# Patient Record
Sex: Female | Born: 1990 | Hispanic: Yes | Marital: Married | Smoking: Never smoker
Health system: Southern US, Community
[De-identification: ages and names within clinical notes are randomized; demographics above are authoritative.]

## PROBLEM LIST (undated history)

## (undated) ENCOUNTER — Inpatient Hospital Stay (HOSPITAL_COMMUNITY): Payer: Self-pay

## (undated) DIAGNOSIS — F329 Major depressive disorder, single episode, unspecified: Secondary | ICD-10-CM

## (undated) DIAGNOSIS — R51 Headache: Secondary | ICD-10-CM

## (undated) DIAGNOSIS — F32A Depression, unspecified: Secondary | ICD-10-CM

## (undated) DIAGNOSIS — C9590 Leukemia, unspecified not having achieved remission: Secondary | ICD-10-CM

## (undated) DIAGNOSIS — G4730 Sleep apnea, unspecified: Secondary | ICD-10-CM

## (undated) DIAGNOSIS — F419 Anxiety disorder, unspecified: Secondary | ICD-10-CM

## (undated) DIAGNOSIS — R519 Headache, unspecified: Secondary | ICD-10-CM

## (undated) HISTORY — PX: WISDOM TOOTH EXTRACTION: SHX21

## (undated) HISTORY — DX: Headache, unspecified: R51.9

## (undated) HISTORY — DX: Headache: R51

## (undated) HISTORY — DX: Depression, unspecified: F32.A

## (undated) HISTORY — DX: Anxiety disorder, unspecified: F41.9

## (undated) HISTORY — DX: Major depressive disorder, single episode, unspecified: F32.9

---
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
## 2010-09-27 ENCOUNTER — Encounter: Payer: Self-pay | Admitting: Internal Medicine

## 2010-09-28 ENCOUNTER — Institutional Professional Consult (permissible substitution): Payer: Self-pay | Admitting: Internal Medicine

## 2011-02-14 ENCOUNTER — Emergency Department (HOSPITAL_COMMUNITY): Payer: BC Managed Care – PPO

## 2011-02-14 ENCOUNTER — Encounter (HOSPITAL_COMMUNITY): Payer: Self-pay | Admitting: *Deleted

## 2011-02-14 ENCOUNTER — Emergency Department (HOSPITAL_COMMUNITY)
Admission: EM | Admit: 2011-02-14 | Discharge: 2011-02-14 | Disposition: A | Discharge status: Home / Self Care | Payer: BC Managed Care – PPO | Attending: Emergency Medicine | Admitting: Emergency Medicine

## 2011-02-14 DIAGNOSIS — F341 Dysthymic disorder: Secondary | ICD-10-CM | POA: Insufficient documentation

## 2011-02-14 DIAGNOSIS — R110 Nausea: Secondary | ICD-10-CM | POA: Insufficient documentation

## 2011-02-14 DIAGNOSIS — J45909 Unspecified asthma, uncomplicated: Secondary | ICD-10-CM | POA: Insufficient documentation

## 2011-02-14 DIAGNOSIS — R350 Frequency of micturition: Secondary | ICD-10-CM | POA: Insufficient documentation

## 2011-02-14 DIAGNOSIS — R109 Unspecified abdominal pain: Secondary | ICD-10-CM | POA: Insufficient documentation

## 2011-02-14 DIAGNOSIS — N390 Urinary tract infection, site not specified: Secondary | ICD-10-CM

## 2011-02-14 LAB — URINALYSIS, MICROSCOPIC ONLY
Glucose, UA: NEGATIVE mg/dL
Nitrite: NEGATIVE
Specific Gravity, Urine: 1.015 (ref 1.005–1.030)
pH: 7 (ref 5.0–8.0)

## 2011-02-14 MED ORDER — ONDANSETRON 4 MG PO TBDP
8.0000 mg | ORAL_TABLET | Freq: Once | ORAL | Status: AC
  Administered 2011-02-14: 8 mg via ORAL
  Filled 2011-02-14: qty 2

## 2011-02-14 MED ORDER — CIPROFLOXACIN HCL 250 MG PO TABS: 250.0000 mg | ORAL_TABLET | Freq: Two times a day (BID) | ORAL | Status: AC

## 2011-02-14 MED ORDER — ONDANSETRON 8 MG PO TBDP: 8.0000 mg | ORAL_TABLET | Freq: Three times a day (TID) | ORAL | Status: AC | PRN

## 2011-02-14 MED ORDER — OXYCODONE-ACETAMINOPHEN 5-325 MG PO TABS
1.0000 | ORAL_TABLET | Freq: Once | ORAL | Status: AC
  Administered 2011-02-14: 1 via ORAL
  Filled 2011-02-14: qty 1

## 2011-02-14 NOTE — ED Notes (Signed)
Pt reports Sunday and Monday saw blood in stool

## 2011-02-14 NOTE — ED Notes (Signed)
Received report from Laneta Simmers, Charity fundraiser. Pt to move over here to wait for ct scan of abd/pelvis w/o contrast.

## 2011-02-14 NOTE — ED Notes (Signed)
Pt reports having (R) sided flank pain, sudden onset tonight.  Reports that when she voided she felt much better.  Pt noted to have blood shot eyes from vomiting forcefully last night.  Denies vomiting tonight.  Skin warm, dry and itnact.  Neuro intact.

## 2011-02-14 NOTE — ED Notes (Signed)
PT sts 0100 went to bed with cramping in stomach and then woke up with pain under left rib area.  No injury.  Pt only reports nausea.  Pt sts frequent urination a couple days ago.  LMP- 3 weeks ago.  Pt reports left flank pain too.

## 2011-02-14 NOTE — ED Provider Notes (Signed)
History     CSN: [id number]  Arrival date & time 02/14/11  0410   First MD Initiated Contact with Patient 02/14/11 [phone]      Chief Complaint  Patient presents with  . Flank Pain    left rib pain    (Consider location/radiation/quality/duration/timing/severity/associated sxs/prior treatment) Patient is a 21 y.o. female presenting with flank pain. The history is provided by the patient.  Flank Pain This is a new problem. The current episode started today. The problem occurs intermittently. The problem has been waxing and waning. Associated symptoms include nausea and urinary symptoms. Pertinent negatives include no abdominal pain, anorexia, change in bowel habit, chest pain, chills, coughing, fever, myalgias, rash or vomiting. The symptoms are aggravated by nothing. She has tried nothing for the symptoms.  Has had intermittent pain in her L flank which awoke her from sleep this evening; was described as a sharp stabbing sensation and lasted for close to 30 minutes. The pain eased and then returned again. It does not radiate. She recalled having a crampy sensation in her abd after dinner last evening. Recalls increased frequency with urination several days ago, but no irritative voiding sx. Denies vaginal dc. Appetite normal.  Her mom has hx of recurrent nephrolithiasis and has had to have urologic interventions to have several of her stones removed.  Past Medical History  Diagnosis Date  . Anxiety   . Depression   . Asthma     History reviewed. No pertinent past surgical history.  History reviewed. No pertinent family history.  History  Substance Use Topics  . Smoking status: Never Smoker   . Smokeless tobacco: Not on file  . Alcohol Use: Yes     occ    OB History    Grav Para Term Preterm Abortions TAB SAB Ect Mult Living                  Review of Systems  Constitutional: Negative for fever and chills.  HENT: Negative.   Respiratory: Negative for cough.     Cardiovascular: Negative for chest pain.  Gastrointestinal: Positive for nausea. Negative for vomiting, abdominal pain, diarrhea, constipation, anorexia and change in bowel habit.  Genitourinary: Positive for flank pain. Negative for vaginal bleeding, vaginal discharge, menstrual problem and pelvic pain.  Musculoskeletal: Negative for myalgias.  Skin: Negative for rash.    Allergies  Review of patient's allergies indicates no known allergies.  Home Medications   Current Outpatient Rx  Name Route Sig Dispense Refill  . ACETAMINOPHEN 500 MG PO TABS Oral Take 1,000 mg by mouth every 6 (six) hours as needed. For pain     . ARIPIPRAZOLE 2 MG PO TABS Oral Take 2 mg by mouth daily.      Marland Kitchen FLUOXETINE HCL 10 MG PO CAPS Oral Take 10 mg by mouth daily.      Marland Kitchen LEVONORGESTREL-ETHINYL ESTRAD 0.1-20 MG-MCG PO TABS Oral Take 1 tablet by mouth daily.        BP 135/82  Pulse 92  Temp(Src) 98.2 F (36.8 C) (Oral)  Resp 18  SpO2 100%  LMP 01/30/2011  Physical Exam  Nursing note and vitals reviewed. Constitutional: She is oriented to person, place, and time. She appears well-developed and well-nourished. No distress.       Nontoxic appearing  HENT:  Head: Normocephalic and atraumatic.  Eyes: Conjunctivae are normal.  Neck: Normal range of motion.  Cardiovascular: Normal rate, regular rhythm and normal heart sounds.   Pulmonary/Chest: Effort normal and  breath sounds normal.  Abdominal: Soft. Bowel sounds are normal. There is no tenderness. There is no rebound.       Tender to palp over L flank; negative CVA tenderness b/l  Musculoskeletal: Normal range of motion.  Neurological: She is alert and oriented to person, place, and time.  Skin: Skin is warm and dry. She is not diaphoretic.    ED Course  Procedures (including critical care time)  Labs Reviewed  URINALYSIS, MICROSCOPIC ONLY - Abnormal; Notable for the following:    APPearance CLOUDY (*)    Hgb urine dipstick LARGE (*)     Protein, ur 30 (*)    Leukocytes, UA MODERATE (*)    Bacteria, UA MANY (*)    Squamous Epithelial / LPF FEW (*)    All other components within normal limits  POCT PREGNANCY, URINE  POCT PREGNANCY, URINE   Ct Abdomen Pelvis Wo Contrast  02/14/2011  *RADIOLOGY REPORT*  Clinical Data: Left flank pain with hematuria.  Question ureteral calculus.  CT ABDOMEN AND PELVIS WITHOUT CONTRAST  Technique:  Multidetector CT imaging of the abdomen and pelvis was performed following the standard protocol without intravenous contrast.  Comparison: None.  Findings: The lung bases are clear.  There is no pleural effusion. Both kidneys appear normal as imaged in the noncontrast state. There is no evidence of urinary tract calculus, hydronephrosis or perinephric soft tissue stranding.  The liver, spleen, gallbladder, pancreas and adrenal glands appear normal.  Moderate stool is noted throughout the colon.  The appendix appears normal.  The uterus, adnexa and urinary bladder appear unremarkable.  Prominent bilateral facet hypertrophy is demonstrated at L5-S1 for age.  IMPRESSION:  1.  No evidence of urinary tract calculus or hydronephrosis. 2.  No acute abdominal pelvic findings. 3.  L5-S1 facet disease.  Original Report Authenticated By: Gerrianne Scale, M.D.     1. Urinary tract infection       MDM  UA with evidence of UTI. Given her intermittent pain in the L flank and family hx stones, I had some concern for renal colic. CT abd was obtained which was negative and no evidence of pyelo noted. Will treat with Cipro. She was also given rx for Zofran. Discussed return precautions. Pt and mom verbalized understanding and agreed to plan.        Grant Fontana, [place] 02/14/11 2229

## 2011-02-16 NOTE — ED Provider Notes (Signed)
History/physical exam/procedure(s) were performed by non-physician practitioner and as supervising physician I was immediately available for consultation/collaboration. I have reviewed all notes and am in agreement with care and plan.   Hilario Quarry, MD 02/16/11 1239

## 2011-11-13 DIAGNOSIS — J45990 Exercise induced bronchospasm: Secondary | ICD-10-CM | POA: Insufficient documentation

## 2012-03-06 DIAGNOSIS — Z9109 Other allergy status, other than to drugs and biological substances: Secondary | ICD-10-CM | POA: Insufficient documentation

## 2016-02-14 NOTE — L&D Delivery Note (Signed)
Delivery Note At 1:42 PM a viable female was delivered via OA Presentation   APGAR: 9 9  Placenta status:spontaneously with 3 vessel cord , .  Cord:  with the following complications:tight .  Cord pH: not obtained  Anesthesia:  epidural Episiotomy:  none Lacerations:  first Suture Repair: 3.0 chromic Est. Blood Loss (mL):  300  Mom to postpartum.  Baby to Couplet care / Skin to Skin.  Corby Vandenberghe L 10/02/2016, 1:55 PM

## 2016-02-29 LAB — OB RESULTS CONSOLE GC/CHLAMYDIA
CHLAMYDIA, DNA PROBE: NEGATIVE
GC PROBE AMP, GENITAL: NEGATIVE

## 2016-02-29 LAB — OB RESULTS CONSOLE ABO/RH: RH Type: POSITIVE

## 2016-02-29 LAB — OB RESULTS CONSOLE RUBELLA ANTIBODY, IGM: Rubella: IMMUNE

## 2016-02-29 LAB — OB RESULTS CONSOLE RPR: RPR: NONREACTIVE

## 2016-02-29 LAB — OB RESULTS CONSOLE HIV ANTIBODY (ROUTINE TESTING): HIV: NONREACTIVE

## 2016-02-29 LAB — OB RESULTS CONSOLE ANTIBODY SCREEN: ANTIBODY SCREEN: NEGATIVE

## 2016-02-29 LAB — OB RESULTS CONSOLE HEPATITIS B SURFACE ANTIGEN: Hepatitis B Surface Ag: NEGATIVE

## 2016-08-28 LAB — OB RESULTS CONSOLE GBS: STREP GROUP B AG: POSITIVE

## 2016-08-30 ENCOUNTER — Inpatient Hospital Stay (HOSPITAL_COMMUNITY)
Admission: AD | Admit: 2016-08-30 | Discharge: 2016-08-31 | Disposition: A | Discharge status: Home / Self Care | Payer: BLUE CROSS/BLUE SHIELD | Source: Ambulatory Visit | Attending: Obstetrics and Gynecology | Admitting: Obstetrics and Gynecology

## 2016-08-30 ENCOUNTER — Encounter: Payer: Self-pay | Admitting: Student

## 2016-08-30 DIAGNOSIS — O4703 False labor before 37 completed weeks of gestation, third trimester: Secondary | ICD-10-CM

## 2016-08-30 DIAGNOSIS — Z3A34 34 weeks gestation of pregnancy: Secondary | ICD-10-CM | POA: Diagnosis not present

## 2016-08-30 LAB — URINALYSIS, ROUTINE W REFLEX MICROSCOPIC
Bilirubin Urine: NEGATIVE
Glucose, UA: NEGATIVE mg/dL
HGB URINE DIPSTICK: NEGATIVE
Ketones, ur: NEGATIVE mg/dL
Leukocytes, UA: NEGATIVE
NITRITE: NEGATIVE
PH: 7 (ref 5.0–8.0)
Protein, ur: NEGATIVE mg/dL
SPECIFIC GRAVITY, URINE: 1.003 — AB (ref 1.005–1.030)

## 2016-08-30 NOTE — MAU Provider Note (Signed)
History     CSN: [id number]  Arrival date and time: 08/30/16 2248  First Provider Initiated Contact with Patient 08/30/16 2329      Chief Complaint  Patient presents with  . Contractions   HPI  Haley Vang is a 26 y.o. G1P0 at 652w6d who presents with contractions. Reports feeling "braxton hicks" contractions 7+ times per hour since yesterday. Rates pain 5/10. Has not treated. Denies vaginal bleeding, LOF, or recent intercourse. Positive fetal movement.   OB History    Gravida Para Term Preterm AB Living   1             SAB TAB Ectopic Multiple Live Births                  Past Medical History:  Diagnosis Date  . Anxiety   . Asthma   . Depression     Past Surgical History:  Procedure Laterality Date  . WISDOM TOOTH EXTRACTION      History reviewed. No pertinent family history.  Social History  Substance Use Topics  . Smoking status: Never Smoker  . Smokeless tobacco: Never Used  . Alcohol use Yes     Comment: occ    Allergies: No Known Allergies  Prescriptions Prior to Admission  Medication Sig Dispense Refill Last Dose  . acetaminophen (TYLENOL) 500 MG tablet Take 1,000 mg by mouth every 6 (six) hours as needed. For pain    02/14/2011 at Unknown  . ARIPiprazole (ABILIFY) 2 MG tablet Take 2 mg by mouth daily.     02/13/2011 at Unknown  . FLUoxetine (PROZAC) 10 MG capsule Take 10 mg by mouth daily.     02/13/2011 at Unknown  . [DISCONTINUED] levonorgestrel-ethinyl estradiol (AVIANE,ALESSE,LESSINA) 0.1-20 MG-MCG tablet Take 1 tablet by mouth daily.     02/13/2011 at Unknown    Review of Systems  Constitutional: Negative.   Gastrointestinal: Positive for abdominal pain. Negative for constipation, diarrhea, nausea and vomiting.  Genitourinary: Negative.    Physical Exam   Blood pressure 132/83, pulse 92, temperature 98.2 F (36.8 C), temperature source Oral, resp. rate 18, height 5\' 5"  (1.651 m), weight 278 lb (126.1 kg), SpO2 99 %.  Physical Exam  Nursing  note and vitals reviewed. Constitutional: She is oriented to person, place, and time. She appears well-developed and well-nourished. No distress.  HENT:  Head: Normocephalic and atraumatic.  Eyes: Conjunctivae are normal. Right eye exhibits no discharge. Left eye exhibits no discharge. No scleral icterus.  Neck: Normal range of motion.  Respiratory: Effort normal. No respiratory distress.  GI: Soft. There is no tenderness.  Neurological: She is alert and oriented to person, place, and time.  Skin: Skin is warm and dry. She is not diaphoretic.  Psychiatric: She has a normal mood and affect. Her behavior is normal. Judgment and thought content normal.   Dilation: Fingertip Effacement (%): Thick Cervical Position: Posterior Exam by:: Judeth HornErin Gladie Gravette, NP  Fetal Tracing:  Baseline: 130 Variability: moderate Accelerations: 15x15 Decelerations: none  Toco: irr ctx MAU Course  Procedures Results for orders placed or performed during the hospital encounter of 08/30/16 (from the past 24 hour(s))  Urinalysis, Routine w reflex microscopic     Status: Abnormal   Collection Time: 08/30/16 11:05 PM  Result Value Ref Range   Color, Urine STRAW (A) YELLOW   APPearance HAZY (A) CLEAR   Specific Gravity, Urine 1.003 (L) 1.005 - 1.030   pH 7.0 5.0 - 8.0   Glucose, UA NEGATIVE NEGATIVE mg/dL  Hgb urine dipstick NEGATIVE NEGATIVE   Bilirubin Urine NEGATIVE NEGATIVE   Ketones, ur NEGATIVE NEGATIVE mg/dL   Protein, ur NEGATIVE NEGATIVE mg/dL   Nitrite NEGATIVE NEGATIVE   Leukocytes, UA NEGATIVE NEGATIVE    MDM Reactive NST SVE unchanged while in MAU in ctx inconsistent S/w Dr. Elon Spanner. Ok to discharge home  Assessment and Plan  A: 1. Preterm uterine contractions in third trimester, antepartum    P: Discharge home Discussed reasons to return to MAU Keep f/u with OB  Judeth Horn 08/30/2016, 11:29 PM

## 2016-08-30 NOTE — MAU Note (Signed)
Pt reports more than 7 contractions per hour. Marland Kitchen. Has been contracting since yesterday.

## 2016-08-31 DIAGNOSIS — O4703 False labor before 37 completed weeks of gestation, third trimester: Secondary | ICD-10-CM

## 2016-08-31 NOTE — Discharge Instructions (Signed)

## 2016-09-20 ENCOUNTER — Encounter (HOSPITAL_COMMUNITY): Admission: AD | Disposition: A | Discharge status: Home / Self Care | Payer: Self-pay | Source: Ambulatory Visit

## 2016-09-20 ENCOUNTER — Encounter (HOSPITAL_COMMUNITY): Payer: Self-pay | Admitting: *Deleted

## 2016-09-20 ENCOUNTER — Inpatient Hospital Stay (HOSPITAL_COMMUNITY): Payer: BC Managed Care – PPO | Admitting: Anesthesiology

## 2016-09-20 ENCOUNTER — Inpatient Hospital Stay (HOSPITAL_COMMUNITY)
Admission: AD | Admit: 2016-09-20 | Discharge: 2016-09-20 | Disposition: A | Discharge status: Home / Self Care | Payer: BC Managed Care – PPO | Source: Ambulatory Visit | Attending: Obstetrics and Gynecology | Admitting: Obstetrics and Gynecology

## 2016-09-20 ENCOUNTER — Inpatient Hospital Stay (HOSPITAL_COMMUNITY): Payer: BC Managed Care – PPO

## 2016-09-20 DIAGNOSIS — Z3A37 37 weeks gestation of pregnancy: Secondary | ICD-10-CM | POA: Diagnosis not present

## 2016-09-20 DIAGNOSIS — O368330 Maternal care for abnormalities of the fetal heart rate or rhythm, third trimester, not applicable or unspecified: Secondary | ICD-10-CM | POA: Diagnosis not present

## 2016-09-20 DIAGNOSIS — O368390 Maternal care for abnormalities of the fetal heart rate or rhythm, unspecified trimester, not applicable or unspecified: Secondary | ICD-10-CM | POA: Diagnosis present

## 2016-09-20 DIAGNOSIS — Z3689 Encounter for other specified antenatal screening: Secondary | ICD-10-CM | POA: Diagnosis not present

## 2016-09-20 SURGERY — Surgical Case
Anesthesia: Regional

## 2016-09-20 MED ORDER — FENTANYL CITRATE (PF) 100 MCG/2ML IJ SOLN
INTRAMUSCULAR | Status: AC
Start: 2016-09-20 — End: ?
  Filled 2016-09-20: qty 2

## 2016-09-20 MED ORDER — BUPIVACAINE HCL (PF) 0.5 % IJ SOLN
INTRAMUSCULAR | Status: AC
  Filled 2016-09-20: qty 30

## 2016-09-20 MED ORDER — MIDAZOLAM HCL 2 MG/2ML IJ SOLN
INTRAMUSCULAR | Status: AC
  Filled 2016-09-20: qty 2

## 2016-09-20 MED ORDER — SUCCINYLCHOLINE CHLORIDE 200 MG/10ML IV SOSY: PREFILLED_SYRINGE | INTRAVENOUS | Status: DC | PRN

## 2016-09-20 MED ORDER — FENTANYL CITRATE (PF) 250 MCG/5ML IJ SOLN
INTRAMUSCULAR | Status: AC
  Filled 2016-09-20: qty 5

## 2016-09-20 MED ORDER — PROPOFOL 10 MG/ML IV BOLUS: INTRAVENOUS | Status: DC | PRN

## 2016-09-20 MED ORDER — MIDAZOLAM HCL 2 MG/2ML IJ SOLN: INTRAMUSCULAR | Status: DC | PRN

## 2016-09-20 MED ORDER — FENTANYL CITRATE (PF) 250 MCG/5ML IJ SOLN: INTRAMUSCULAR | Status: DC | PRN

## 2016-09-20 MED ORDER — LACTATED RINGERS IV SOLN
INTRAVENOUS | Status: AC | PRN
  Administered 2016-09-20: 19:00:00 via INTRAVENOUS

## 2016-09-20 NOTE — Anesthesia Procedure Notes (Deleted)
Procedure Name: Intubation Performed by: Ignacia Bayley Pre-anesthesia Checklist: Patient identified, Emergency Drugs available, Suction available, Patient being monitored and Timeout performed Patient Re-evaluated:Patient Re-evaluated prior to induction Oxygen Delivery Method: Circle system utilized Preoxygenation: Pre-oxygenation with 100% oxygen Induction Type: IV induction Laryngoscope Size: Glidescope, 3 and Mac Grade View: Grade II Tube type: Oral Tube size: 7.0 mm Number of attempts: 1 Airway Equipment and Method: Video-laryngoscopy Placement Confirmation: ETT inserted through vocal cords under direct vision,  positive ETCO2 and breath sounds checked- equal and bilateral Secured at: 21 cm Tube secured with: Tape Dental Injury: Teeth and Oropharynx as per pre-operative assessment

## 2016-09-20 NOTE — MAU Note (Signed)
Presents to mau following office visit today.  nst was questionable. Sent to hospital for nst and ultrsound.

## 2016-09-20 NOTE — MAU Provider Note (Signed)
  History     CSN: [id number]  Arrival date and time: 09/20/16 1614   First Provider Initiated Contact with Patient 09/20/16 1756      Chief Complaint  Patient presents with  . Non-stress Test   HPI   [name] is a 26 y.o. female G1P0 @ 2748w5d here in MAU for a stress test and a BPP. She was seen in the office today for a stress test and there was a questionable fetal deceleration. She was sent here for further fetal monitoring and a BPP. + fetal movement. Denies vaginal bleeding.   OB History    Gravida Para Term Preterm AB Living   1             SAB TAB Ectopic Multiple Live Births                  Past Medical History:  Diagnosis Date  . Anxiety   . Asthma   . Depression     Past Surgical History:  Procedure Laterality Date  . WISDOM TOOTH EXTRACTION      No family history on file.  Social History  Substance Use Topics  . Smoking status: Never Smoker  . Smokeless tobacco: Never Used  . Alcohol use Yes     Comment: occ    Allergies: No Known Allergies  Prescriptions Prior to Admission  Medication Sig Dispense Refill Last Dose  . acetaminophen (TYLENOL) 500 MG tablet Take 1,000 mg by mouth every 6 (six) hours as needed. For pain    02/14/2011 at Unknown  . ARIPiprazole (ABILIFY) 2 MG tablet Take 2 mg by mouth daily.     02/13/2011 at Unknown  . FLUoxetine (PROZAC) 10 MG capsule Take 10 mg by mouth daily.     02/13/2011 at Unknown   No results found for this or any previous visit (from the past 48 hour(s)).  Review of Systems  Gastrointestinal: Negative for abdominal pain.  Genitourinary: Negative for vaginal bleeding and vaginal discharge.   Physical Exam   Blood pressure 134/76, pulse 100, temperature 98.1 F (36.7 C), resp. rate 18, SpO2 100 %.  Physical Exam  Constitutional: She is oriented to person, place, and time. She appears well-developed and well-nourished. No distress.  HENT:  Head: Normocephalic.  Eyes: Pupils are equal, round, and  reactive to light.  Musculoskeletal: Normal range of motion.  Neurological: She is alert and oriented to person, place, and time.  Skin: Skin is warm. She is not diaphoretic.  Psychiatric: Her behavior is normal.   Fetal Tracing: Baseline: 140 bpm Variability: moderate  Accelerations: 15x15 Decelerations: none Toco: 2 contractions noted.   MAU Course  Procedures  None  MDM  BPP ordered  BPP 8/8 Discussed with Dr. Vincente PoliGrewal   Assessment and Plan   A:  1. [redacted] weeks gestation of pregnancy   2. Antepartum variable deceleration   3. NST (non-stress test) reactive     P:  Discharge home in stable condition  Return to MAU if symptoms worsen  Follow up with Dr. Arvil PersonsGrewal  Rasch, Harolyn RutherfordJennifer I, NP 09/20/2016 9:00 PM

## 2016-09-20 NOTE — Discharge Instructions (Signed)
Biophysical Profile °A biophysical profile is a non-invasive test that may be done during pregnancy to check that your developing baby (fetus) and your placenta are healthy. Your health care provider may recommend a biophysical profile if your pregnancy is at a higher risk for certain problems. A biophysical profile is usually done during the last 3 months of pregnancy (third trimester). °A biophysical profile combines two tests to check the health of your baby. In one test, you will have a device strapped to your belly to measure your baby’s heart rate. The other test involves using sound waves and a computer (ultrasound) to create an image of your baby inside your womb (uterus). Together, these tests tell your health care provider about the overall health of your baby. °Tell a health care provider about: °· Any allergies you have. °· All medicines you are taking, including vitamins, herbs, eye drops, creams, and over-the-counter medicines. °· Any medical conditions you have. °· Any concerns you have about your pregnancy. °· Any symptoms such as abdominal pain or contractions, nausea or vomiting, vaginal bleeding, leaking of amniotic fluid, decreased fetal movements, fever or infection, increased swelling, headaches, or visual disturbances. °· How often you feel your baby move. °What are the risks? °There are no risks to you or your baby from a biophysical profile. °What happens before the procedure? °Ask your health care provider how to prepare. °· You may need to drink fluids so that you have a full bladder for your ultrasound. °· You may also need to eat before you arrive for the test. That makes your baby more active. ° °What happens during the procedure? °· You will lie on your back on an exam table. °· Your blood pressure may be monitored during the procedure. °· A belt will be placed around your belly. The belt has a sensor to measure your baby’s heart rate. °· You may have to wear another belt and sensor to  measure any muscle movements (contractions) in your uterus. During the ultrasound, a health care provider or technician will gently roll a handheld device (transducer) over your belly. This device sends signals to a computer that creates images of your baby. °· Five areas of your baby’s health and development will be checked during the biophysical profile: °? Heart rate. °? Breathing. °? Movement. °? Active muscle movement (muscle tone). °? The amount of fluid in your uterus (amniotic fluid). °The procedure may vary among health care providers and hospitals. °What happens after the procedure? °· Your health care provider will discuss your results with you. The results of a biophysical profile are scored in a range of 0 to 10. Each area that is evaluated is given a score of 0 or 2 points. If you get a score of 6 or less, you may need further testing, or your baby may need to be delivered early. A score of 8 to 10 with normal amniotic fluid levels is considered normal. °· Unless you need additional testing, you can go home right after the procedure and resume your normal activities. °Summary °· A biophysical profile is a non-invasive test that may be done during pregnancy to check that your developing baby (fetus) and your placenta are healthy. °· A biophysical profile combines two tests: A test to measure your baby's heart rate and an ultrasound test to create an image of your baby in the womb. °· Tell your health care provider about any concerns you have about your pregnancy or any pregnancy-related symptoms. °· If you   get a score of 6 or less, you may need further testing, or your baby may need to be delivered early. A score of 8 to 10 with normal amniotic fluid levels is considered normal. °This information is not intended to replace advice given to you by your health care provider. Make sure you discuss any questions you have with your health care provider. °Document Released: 01/20/2002 Document Revised:  03/03/2016 Document Reviewed: 03/03/2016 °Elsevier Interactive Patient Education © 2018 [employer] ° °

## 2016-09-21 ENCOUNTER — Encounter (HOSPITAL_COMMUNITY): Payer: Self-pay | Admitting: *Deleted

## 2016-09-21 ENCOUNTER — Telehealth (HOSPITAL_COMMUNITY): Payer: Self-pay | Admitting: *Deleted

## 2016-09-21 NOTE — Telephone Encounter (Signed)
Preadmission screen  

## 2016-10-01 ENCOUNTER — Encounter (HOSPITAL_COMMUNITY): Payer: Self-pay

## 2016-10-01 ENCOUNTER — Inpatient Hospital Stay (HOSPITAL_COMMUNITY)
Admission: AD | Admit: 2016-10-01 | Discharge: 2016-10-04 | DRG: 775 | Disposition: A | Discharge status: Home / Self Care | Payer: BC Managed Care – PPO | Source: Ambulatory Visit | Attending: Obstetrics and Gynecology | Admitting: Obstetrics and Gynecology

## 2016-10-01 DIAGNOSIS — O99824 Streptococcus B carrier state complicating childbirth: Secondary | ICD-10-CM | POA: Diagnosis present

## 2016-10-01 DIAGNOSIS — O9952 Diseases of the respiratory system complicating childbirth: Secondary | ICD-10-CM | POA: Diagnosis present

## 2016-10-01 DIAGNOSIS — J45909 Unspecified asthma, uncomplicated: Secondary | ICD-10-CM | POA: Diagnosis present

## 2016-10-01 DIAGNOSIS — Z3A39 39 weeks gestation of pregnancy: Secondary | ICD-10-CM | POA: Diagnosis not present

## 2016-10-01 DIAGNOSIS — O26893 Other specified pregnancy related conditions, third trimester: Secondary | ICD-10-CM | POA: Diagnosis present

## 2016-10-01 LAB — CBC
HEMATOCRIT: 34.2 % — AB (ref 36.0–46.0)
HEMOGLOBIN: 11.6 g/dL — AB (ref 12.0–15.0)
MCH: 28.9 pg (ref 26.0–34.0)
MCHC: 33.9 g/dL (ref 30.0–36.0)
MCV: 85.3 fL (ref 78.0–100.0)
Platelets: 220 10*3/uL (ref 150–400)
RBC: 4.01 MIL/uL (ref 3.87–5.11)
RDW: 15 % (ref 11.5–15.5)
WBC: 6.9 10*3/uL (ref 4.0–10.5)

## 2016-10-01 LAB — TYPE AND SCREEN
ABO/RH(D): O POS
Antibody Screen: NEGATIVE

## 2016-10-01 LAB — POCT FERN TEST

## 2016-10-01 LAB — ABO/RH: ABO/RH(D): O POS

## 2016-10-01 MED ORDER — OXYCODONE-ACETAMINOPHEN 5-325 MG PO TABS: 1.0000 | ORAL_TABLET | ORAL | Status: DC | PRN

## 2016-10-01 MED ORDER — PHENYLEPHRINE 40 MCG/ML (10ML) SYRINGE FOR IV PUSH (FOR BLOOD PRESSURE SUPPORT)
80.0000 ug | PREFILLED_SYRINGE | INTRAVENOUS | Status: DC | PRN
  Filled 2016-10-01: qty 5

## 2016-10-01 MED ORDER — FLEET ENEMA 7-19 GM/118ML RE ENEM: 1.0000 | ENEMA | RECTAL | Status: DC | PRN

## 2016-10-01 MED ORDER — OXYTOCIN BOLUS FROM INFUSION
500.0000 mL | Freq: Once | INTRAVENOUS | Status: AC
  Administered 2016-10-02: 500 mL via INTRAVENOUS

## 2016-10-01 MED ORDER — ACETAMINOPHEN 325 MG PO TABS: 650.0000 mg | ORAL_TABLET | ORAL | Status: DC | PRN

## 2016-10-01 MED ORDER — EPHEDRINE 5 MG/ML INJ
10.0000 mg | INTRAVENOUS | Status: DC | PRN
  Filled 2016-10-01: qty 2

## 2016-10-01 MED ORDER — LIDOCAINE HCL (PF) 1 % IJ SOLN
30.0000 mL | INTRAMUSCULAR | Status: DC | PRN
  Filled 2016-10-01: qty 30

## 2016-10-01 MED ORDER — OXYTOCIN 40 UNITS IN LACTATED RINGERS INFUSION - SIMPLE MED
1.0000 m[IU]/min | INTRAVENOUS | Status: DC
  Administered 2016-10-01: 1 m[IU]/min via INTRAVENOUS

## 2016-10-01 MED ORDER — TERBUTALINE SULFATE 1 MG/ML IJ SOLN
0.2500 mg | Freq: Once | INTRAMUSCULAR | Status: DC | PRN
  Filled 2016-10-01: qty 1

## 2016-10-01 MED ORDER — LACTATED RINGERS IV SOLN
500.0000 mL | Freq: Once | INTRAVENOUS | Status: AC
  Administered 2016-10-02: 500 mL via INTRAVENOUS

## 2016-10-01 MED ORDER — FENTANYL 2.5 MCG/ML BUPIVACAINE 1/10 % EPIDURAL INFUSION (WH - ANES)
14.0000 mL/h | INTRAMUSCULAR | Status: DC | PRN
  Administered 2016-10-02 (×2): 14 mL/h via EPIDURAL
  Filled 2016-10-01 (×2): qty 100

## 2016-10-01 MED ORDER — LACTATED RINGERS IV SOLN
500.0000 mL | INTRAVENOUS | Status: DC | PRN
  Administered 2016-10-02: 500 mL via INTRAVENOUS

## 2016-10-01 MED ORDER — SOD CITRATE-CITRIC ACID 500-334 MG/5ML PO SOLN: 30.0000 mL | ORAL | Status: DC | PRN

## 2016-10-01 MED ORDER — PHENYLEPHRINE 40 MCG/ML (10ML) SYRINGE FOR IV PUSH (FOR BLOOD PRESSURE SUPPORT)
80.0000 ug | PREFILLED_SYRINGE | INTRAVENOUS | Status: DC | PRN
  Filled 2016-10-01: qty 10
  Filled 2016-10-01: qty 5

## 2016-10-01 MED ORDER — ARIPIPRAZOLE 2 MG PO TABS
2.0000 mg | ORAL_TABLET | Freq: Every day | ORAL | Status: DC
  Filled 2016-10-01 (×2): qty 1

## 2016-10-01 MED ORDER — DIPHENHYDRAMINE HCL 50 MG/ML IJ SOLN: 12.5000 mg | INTRAMUSCULAR | Status: DC | PRN

## 2016-10-01 MED ORDER — OXYTOCIN 40 UNITS IN LACTATED RINGERS INFUSION - SIMPLE MED
2.5000 [IU]/h | INTRAVENOUS | Status: DC
  Filled 2016-10-01: qty 1000

## 2016-10-01 MED ORDER — LACTATED RINGERS IV SOLN
INTRAVENOUS | Status: DC
  Administered 2016-10-01 – 2016-10-02 (×2): via INTRAVENOUS

## 2016-10-01 MED ORDER — SODIUM CHLORIDE 0.9 % IV SOLN
2.0000 g | Freq: Once | INTRAVENOUS | Status: AC
  Administered 2016-10-01: 2 g via INTRAVENOUS
  Filled 2016-10-01: qty 2000

## 2016-10-01 MED ORDER — PENICILLIN G POT IN DEXTROSE 60000 UNIT/ML IV SOLN
3.0000 10*6.[IU] | INTRAVENOUS | Status: DC
  Administered 2016-10-01 – 2016-10-02 (×4): 3 10*6.[IU] via INTRAVENOUS
  Filled 2016-10-01 (×8): qty 50

## 2016-10-01 MED ORDER — OXYCODONE-ACETAMINOPHEN 5-325 MG PO TABS: 2.0000 | ORAL_TABLET | ORAL | Status: DC | PRN

## 2016-10-01 MED ORDER — FAMOTIDINE 20 MG PO TABS
20.0000 mg | ORAL_TABLET | Freq: Every day | ORAL | Status: DC
  Administered 2016-10-01: 20 mg via ORAL
  Filled 2016-10-01: qty 1

## 2016-10-01 MED ORDER — FLUOXETINE HCL 10 MG PO CAPS
10.0000 mg | ORAL_CAPSULE | Freq: Every day | ORAL | Status: DC
  Administered 2016-10-01: 10 mg via ORAL
  Filled 2016-10-01 (×2): qty 1

## 2016-10-01 MED ORDER — PENICILLIN G POTASSIUM 5000000 UNITS IJ SOLR
5.0000 10*6.[IU] | Freq: Once | INTRAMUSCULAR | Status: AC
  Administered 2016-10-01: 5 10*6.[IU] via INTRAVENOUS
  Filled 2016-10-01: qty 5

## 2016-10-01 MED ORDER — ONDANSETRON HCL 4 MG/2ML IJ SOLN
4.0000 mg | Freq: Four times a day (QID) | INTRAMUSCULAR | Status: DC | PRN
  Administered 2016-10-02 (×2): 4 mg via INTRAVENOUS
  Filled 2016-10-01 (×2): qty 2

## 2016-10-01 NOTE — H&P (Signed)
Haley Vang is a 26 y.o. female presenting for SROM at 2 PM with irreg ctx. OB History    Gravida Para Term Preterm AB Living   1             SAB TAB Ectopic Multiple Live Births                 Past Medical History:  Diagnosis Date  . Anxiety   . Asthma   . Depression   . Headache    Past Surgical History:  Procedure Laterality Date  . WISDOM TOOTH EXTRACTION     Family History: family history includes Depression in her father; Hypertension in her mother; Stroke in her paternal grandmother; Thyroid disease in her mother. Social History:  reports that she has never smoked. She has never used smokeless tobacco. She reports that she drinks alcohol. She reports that she does not use drugs.     Maternal Diabetes: No Genetic Screening: Normal Maternal Ultrasounds/Referrals: Normal Fetal Ultrasounds or other Referrals:  None Maternal Substance Abuse:  No Significant Maternal Medications:  None Significant Maternal Lab Results:  None Other Comments:  None  ROS Maternal Medical History:  Reason for admission: Rupture of membranes.   Contractions: Onset was 3-5 hours ago.   Frequency: irregular.   Perceived severity is mild.    Fetal activity: Perceived fetal activity is normal.   Last perceived fetal movement was within the past hour.      Dilation: 1 Effacement (%): 80 Exam by:: D. Poe CNM Blood pressure 131/85, pulse (!) 105, temperature 97.7 F (36.5 C), temperature source Oral, resp. rate 18, height 5\' 5"  (1.651 m), weight 287 lb 1.9 oz (130.2 kg). Exam Physical Exam  Prenatal labs: ABO, Rh: O/Positive/-- (01/16 0000) Antibody: Negative (01/16 0000) Rubella: Immune (01/16 0000) RPR: Nonreactive (01/16 0000)  HBsAg: Negative (01/16 0000)  HIV: Non-reactive (01/16 0000)  GBS: Positive (07/16 0000)   Assessment/Plan: Term preg, hx + GBS, SROM   Kameryn Davern M 10/01/2016, 4:41 PM

## 2016-10-01 NOTE — Progress Notes (Signed)
36 Dr Marcelle Overlie updated on fhr with decreased variability at times now with 15x15 accels and occasional mild variables, uc's q 4 minutes. Notified pt requests to walk. Orders received to let patient eat light meal, walk and start pitocin 1/1 mu/min. To change antibiotic over to PCN.

## 2016-10-01 NOTE — MAU Note (Signed)
Called Dr. Marcelle Overlie patient SROM around 1:30 PM clear fluid, positive fern test, 1/80/-2 posterior, GBS+, received verbal admit orders.

## 2016-10-01 NOTE — MAU Note (Signed)
Patient presents with SROM at 1:45 pm.

## 2016-10-01 NOTE — Anesthesia Pain Management Evaluation Note (Signed)
  [facility] Pain Management Visit Note  Patient: Haley Vang, 26 y.o., female  "Hello I am a member of the anesthesia team at [facility]. We have an anesthesia team available at all times to provide care throughout the hospital, including epidural management and anesthesia for C-section. I don't know your plan for the delivery whether it a natural birth, water birth, IV sedation, nitrous supplementation, doula or epidural, but we want to meet your pain goals."   1.Was your pain managed to your expectations on prior hospitalizations?   No prior hospitalizations  2.What is your expectation for pain management during this hospitalization?     Epidural  3.How can we help you reach that goal? epidural  Record the patient's initial score and the patient's pain goal.   Pain: 0  Pain Goal: 5 [facility] wants you to be able to say your pain was always managed very well.  Avyana Puffenbarger 10/01/2016

## 2016-10-01 NOTE — MAU Note (Signed)
Urine in lab 

## 2016-10-02 ENCOUNTER — Inpatient Hospital Stay (HOSPITAL_COMMUNITY): Payer: BC Managed Care – PPO | Admitting: Anesthesiology

## 2016-10-02 ENCOUNTER — Inpatient Hospital Stay (HOSPITAL_COMMUNITY)
Admission: RE | Admit: 2016-10-02 | Payer: BLUE CROSS/BLUE SHIELD | Source: Ambulatory Visit | Attending: Obstetrics and Gynecology | Admitting: Obstetrics and Gynecology

## 2016-10-02 ENCOUNTER — Encounter (HOSPITAL_COMMUNITY): Payer: Self-pay | Admitting: *Deleted

## 2016-10-02 LAB — CBC
HEMATOCRIT: 31.8 % — AB (ref 36.0–46.0)
HEMOGLOBIN: 10.8 g/dL — AB (ref 12.0–15.0)
MCH: 29.3 pg (ref 26.0–34.0)
MCHC: 34 g/dL (ref 30.0–36.0)
MCV: 86.2 fL (ref 78.0–100.0)
Platelets: 231 10*3/uL (ref 150–400)
RBC: 3.69 MIL/uL — ABNORMAL LOW (ref 3.87–5.11)
RDW: 15.3 % (ref 11.5–15.5)
WBC: 8.2 10*3/uL (ref 4.0–10.5)

## 2016-10-02 LAB — RPR: RPR Ser Ql: NONREACTIVE

## 2016-10-02 MED ORDER — MEASLES, MUMPS & RUBELLA VAC ~~LOC~~ INJ: 0.5000 mL | INJECTION | Freq: Once | SUBCUTANEOUS | Status: DC

## 2016-10-02 MED ORDER — BENZOCAINE-MENTHOL 20-0.5 % EX AERO
1.0000 "application " | INHALATION_SPRAY | CUTANEOUS | Status: DC | PRN
  Administered 2016-10-02: 1 via TOPICAL
  Filled 2016-10-02 (×2): qty 56

## 2016-10-02 MED ORDER — ONDANSETRON HCL 4 MG PO TABS: 4.0000 mg | ORAL_TABLET | ORAL | Status: DC | PRN

## 2016-10-02 MED ORDER — FLEET ENEMA 7-19 GM/118ML RE ENEM: 1.0000 | ENEMA | Freq: Every day | RECTAL | Status: DC | PRN

## 2016-10-02 MED ORDER — COCONUT OIL OIL: 1.0000 "application " | TOPICAL_OIL | Status: DC | PRN

## 2016-10-02 MED ORDER — PRENATAL MULTIVITAMIN CH
1.0000 | ORAL_TABLET | Freq: Every day | ORAL | Status: DC
  Administered 2016-10-03: 1 via ORAL
  Filled 2016-10-02: qty 1

## 2016-10-02 MED ORDER — LIDOCAINE HCL (PF) 1 % IJ SOLN
INTRAMUSCULAR | Status: DC | PRN
  Administered 2016-10-02 (×2): 4 mL

## 2016-10-02 MED ORDER — ARIPIPRAZOLE 2 MG PO TABS
2.0000 mg | ORAL_TABLET | Freq: Every day | ORAL | Status: DC
  Administered 2016-10-02 – 2016-10-04 (×3): 2 mg via ORAL
  Filled 2016-10-02 (×3): qty 1

## 2016-10-02 MED ORDER — TETANUS-DIPHTH-ACELL PERTUSSIS 5-2.5-18.5 LF-MCG/0.5 IM SUSP: 0.5000 mL | Freq: Once | INTRAMUSCULAR | Status: DC

## 2016-10-02 MED ORDER — FLUOXETINE HCL 10 MG PO CAPS
10.0000 mg | ORAL_CAPSULE | Freq: Every day | ORAL | Status: DC
  Administered 2016-10-02 – 2016-10-04 (×3): 10 mg via ORAL
  Filled 2016-10-02 (×3): qty 1

## 2016-10-02 MED ORDER — MEDROXYPROGESTERONE ACETATE 150 MG/ML IM SUSP: 150.0000 mg | INTRAMUSCULAR | Status: DC | PRN

## 2016-10-02 MED ORDER — SENNOSIDES-DOCUSATE SODIUM 8.6-50 MG PO TABS
2.0000 | ORAL_TABLET | ORAL | Status: DC
Start: 2016-10-03 — End: 2016-10-04
  Administered 2016-10-02 – 2016-10-03 (×2): 2 via ORAL
  Filled 2016-10-02 (×2): qty 2

## 2016-10-02 MED ORDER — BISACODYL 10 MG RE SUPP: 10.0000 mg | Freq: Every day | RECTAL | Status: DC | PRN

## 2016-10-02 MED ORDER — DIBUCAINE 1 % RE OINT: 1.0000 "application " | TOPICAL_OINTMENT | RECTAL | Status: DC | PRN

## 2016-10-02 MED ORDER — SIMETHICONE 80 MG PO CHEW: 80.0000 mg | CHEWABLE_TABLET | ORAL | Status: DC | PRN

## 2016-10-02 MED ORDER — ONDANSETRON HCL 4 MG/2ML IJ SOLN: 4.0000 mg | INTRAMUSCULAR | Status: DC | PRN

## 2016-10-02 MED ORDER — DIPHENHYDRAMINE HCL 25 MG PO CAPS: 25.0000 mg | ORAL_CAPSULE | Freq: Four times a day (QID) | ORAL | Status: DC | PRN

## 2016-10-02 MED ORDER — ZOLPIDEM TARTRATE 5 MG PO TABS: 5.0000 mg | ORAL_TABLET | Freq: Every evening | ORAL | Status: DC | PRN

## 2016-10-02 MED ORDER — FENTANYL 2.5 MCG/ML BUPIVACAINE 1/10 % EPIDURAL INFUSION (WH - ANES): 14.0000 mL/h | INTRAMUSCULAR | Status: DC | PRN

## 2016-10-02 MED ORDER — WITCH HAZEL-GLYCERIN EX PADS: 1.0000 "application " | MEDICATED_PAD | CUTANEOUS | Status: DC | PRN

## 2016-10-02 MED ORDER — ALBUTEROL SULFATE (2.5 MG/3ML) 0.083% IN NEBU: 3.0000 mL | INHALATION_SOLUTION | Freq: Four times a day (QID) | RESPIRATORY_TRACT | Status: DC | PRN

## 2016-10-02 MED ORDER — ACETAMINOPHEN 325 MG PO TABS
650.0000 mg | ORAL_TABLET | ORAL | Status: DC | PRN
  Administered 2016-10-02 – 2016-10-04 (×8): 650 mg via ORAL
  Filled 2016-10-02 (×8): qty 2

## 2016-10-02 NOTE — Progress Notes (Signed)
toco contractions hard to trace  Cervix is 90% 4.5 -1 Vertex  IUPC placed  Continue to follow labor curve

## 2016-10-02 NOTE — Anesthesia Preprocedure Evaluation (Signed)
Anesthesia Evaluation  Patient identified by MRN, date of birth, ID band Patient awake    Reviewed: Allergy & Precautions, Patient's Chart, lab work & pertinent test results  Airway Mallampati: III  TM Distance: >3 FB Neck ROM: Full    Dental no notable dental hx.    Pulmonary neg pulmonary ROS,    Pulmonary exam normal breath sounds clear to auscultation       Cardiovascular negative cardio ROS Normal cardiovascular exam Rhythm:Regular Rate:Normal     Neuro/Psych negative neurological ROS  negative psych ROS   GI/Hepatic negative GI ROS, Neg liver ROS,   Endo/Other  negative endocrine ROS  Renal/GU negative Renal ROS  negative genitourinary   Musculoskeletal negative musculoskeletal ROS (+)   Abdominal (+) + obese,   Peds negative pediatric ROS (+)  Hematology negative hematology ROS (+)   Anesthesia Other Findings   Reproductive/Obstetrics negative OB ROS                             Anesthesia Physical Anesthesia Plan  ASA: III  Anesthesia Plan: Epidural   Post-op Pain Management:    Induction:   PONV Risk Score and Plan:   Airway Management Planned:   Additional Equipment:   Intra-op Plan:   Post-operative Plan:   Informed Consent: I have reviewed the patients History and Physical, chart, labs and discussed the procedure including the risks, benefits and alternatives for the proposed anesthesia with the patient or authorized representative who has indicated his/her understanding and acceptance.     Plan Discussed with:   Anesthesia Plan Comments:         Anesthesia Quick Evaluation

## 2016-10-02 NOTE — Lactation Note (Signed)
This note was copied from a baby's chart. Lactation Consultation Note  Patient Name: Girl Makiba Butterworth [id number] Date: 10/02/2016 Reason for consult: Initial assessment;Primapara  Initial visit attempted at 3 hours of life. Mom has multiple visitors in room.  Mom has my # to call when ready for consult.  [name] [facility] 10/02/2016, 5:13 PM

## 2016-10-02 NOTE — Lactation Note (Addendum)
This note was copied from a baby's chart. Lactation Consultation Note  Patient Name: Haley Vang [id number] Date: 10/02/2016 Reason for consult: Initial assessment;Primapara  Dad called out for latch assist. Mom's nipples appear short-shafted. Using the teacup hold, infant latched w/ease. Swallows were audible to the naked ear. Mom comfortable w/latch.  Mom made aware of O/P services, breastfeeding support groups, community resources, and our phone # for post-discharge questions.   Mom reports + breast changes w/pregnancy.   Mom is taking fluoxetine 10mg  qd (L2) & aripiprazole 2 mg qd (L3). Per Maisie Fus Hale's "Medications & Mother's Milk" 2017, aripiprazole may be associated with low prolactin levels.   [name] [facility] 10/02/2016, 5:43 PM

## 2016-10-02 NOTE — Lactation Note (Signed)
This note was copied from a baby's chart. Lactation Consultation Note  Patient Name: Haley Vang [id number] Date: 10/02/2016   Parents had called out for latch assist. Mom had been able to get baby latched on her L breast, but after 15 minutes, she found it too uncomfortable.   I helped with latching to the R breast, using the teacup hold. Infant latched w/ease. Infant did need to have chin lowered to widen gape, which was explained to Mom & Dad. Mom comfortable w/resulting latch.   [name] [facility] 10/02/2016, 9:18 PM

## 2016-10-02 NOTE — Anesthesia Postprocedure Evaluation (Signed)
Anesthesia Post Note  Patient: Haley Vang  Procedure(s) Performed: * No procedures listed *     Patient location during evaluation: Mother Baby Anesthesia Type: Epidural Level of consciousness: awake and alert Pain management: pain level controlled Vital Signs Assessment: post-procedure vital signs reviewed and stable Respiratory status: spontaneous breathing, nonlabored ventilation and respiratory function stable Cardiovascular status: stable Postop Assessment: no headache, no backache and epidural receding Anesthetic complications: no    Last Vitals:  Vitals:   10/02/16 1545 10/02/16 1650  BP: 133/71 134/72  Pulse: 95 89  Resp: 16 16  Temp: 37.3 C 37.4 C  SpO2: 98% 98%    Last Pain:  Vitals:   10/02/16 1650  TempSrc: Oral  PainSc: 4    Pain Goal: Patients Stated Pain Goal: 1 (10/02/16 1650)               Marrion Coy

## 2016-10-02 NOTE — Progress Notes (Signed)
Patient comfortable with epidural.  FHR category 1  Pitocin at 8 mu  Cervix is 90% 4.5 / -2 Vertex  Continue pitocin Follow labor curve

## 2016-10-02 NOTE — Anesthesia Procedure Notes (Signed)
Epidural Patient location during procedure: OB  Staffing Anesthesiologist: Ida Milbrath Performed: anesthesiologist   Preanesthetic Checklist Completed: patient identified, pre-op evaluation, timeout performed, IV checked, risks and benefits discussed and monitors and equipment checked  Epidural Patient position: sitting Prep: site prepped and draped and DuraPrep Patient monitoring: heart rate, continuous pulse ox and blood pressure Approach: midline Location: L3-L4 Injection technique: LOR air and LOR saline  Needle:  Needle type: Tuohy  Needle gauge: 17 G Needle length: 9 cm Needle insertion depth: 8 cm Catheter type: closed end flexible Catheter size: 19 Gauge Catheter at skin depth: 13 cm Test dose: negative  Assessment Sensory level: T8 Events: blood not aspirated, injection not painful, no injection resistance, negative IV test and no paresthesia  Additional Notes Reason for block:procedure for pain     

## 2016-10-03 LAB — CBC
HEMATOCRIT: 29.7 % — AB (ref 36.0–46.0)
HEMOGLOBIN: 10.3 g/dL — AB (ref 12.0–15.0)
MCH: 29.6 pg (ref 26.0–34.0)
MCHC: 34.7 g/dL (ref 30.0–36.0)
MCV: 85.3 fL (ref 78.0–100.0)
Platelets: 199 10*3/uL (ref 150–400)
RBC: 3.48 MIL/uL — ABNORMAL LOW (ref 3.87–5.11)
RDW: 15.3 % (ref 11.5–15.5)
WBC: 9.7 10*3/uL (ref 4.0–10.5)

## 2016-10-03 MED ORDER — IBUPROFEN 600 MG PO TABS: 600.0000 mg | ORAL_TABLET | Freq: Four times a day (QID) | ORAL | Status: DC

## 2016-10-03 MED ORDER — OXYCODONE HCL 5 MG PO TABS
5.0000 mg | ORAL_TABLET | ORAL | Status: DC | PRN
  Administered 2016-10-03 – 2016-10-04 (×3): 5 mg via ORAL
  Filled 2016-10-03 (×3): qty 1

## 2016-10-03 MED ORDER — OXYCODONE HCL 5 MG PO TABS
10.0000 mg | ORAL_TABLET | ORAL | Status: DC | PRN
  Administered 2016-10-03 (×2): 10 mg via ORAL
  Filled 2016-10-03 (×2): qty 2

## 2016-10-03 NOTE — Progress Notes (Signed)
Post Partum Day 1 Subjective: no complaints, up ad lib, voiding, tolerating PO and + flatus  Objective: Blood pressure 140/86, pulse 81, temperature 98.3 F (36.8 C), temperature source Oral, resp. rate 18, height 5\' 5"  (1.651 m), weight 130.2 kg (287 lb 1.9 oz), SpO2 98 %, unknown if currently breastfeeding.  Physical Exam:  General: alert, cooperative and appears stated age Lochia: appropriate Uterine Fundus: firm Incision: healing well, no significant drainage, no dehiscence, no significant erythema DVT Evaluation: No evidence of DVT seen on physical exam. Negative Homan's sign. No cords or calf tenderness. No significant calf/ankle edema.   Recent Labs  10/02/16 0047 10/03/16 0546  HGB 10.8* 10.3*  HCT 31.8* 29.7*    Assessment/Plan: Plan for discharge tomorrow and Breastfeeding. It's a girl!   LOS: 2 days   Ranae Pila 10/03/2016, 8:57 AM

## 2016-10-03 NOTE — Lactation Note (Signed)
This note was copied from a baby's chart. Lactation Consultation Note  Patient Name: Haley Vang [id number] Date: 10/03/2016 Reason for consult: Follow-up assessment Baby at 21 hr of life and RN requested help with latch. Mom has round soft breast with flat nipples. Baby has a tight jaw but has a nice gape. Baby will latch well for 2-3 sucks then pull back and cause mom "pretty bad" nipple pain. Mom's nipple are bright pink around the shaft with white compression stripes across the nipple surface. Given comfort gels. Applied #20 NS and baby was able to maintain long bursts of sucking. Baby needed help flanging lips and stimulation to continue sucking. A few swallows were noted and there was a few drops of clear colostrum in the NS when baby came off the breast. Showed mom how to place a rolled up washcloth under the breast so she can see the baby latched. Showed dad to help mom position baby. Discussed baby behavior, feeding frequency, baby belly size, voids, wt loss, breast changes, and nipple care. Parents are aware of lactation services and support group.    Maternal Data Has patient been taught Hand Expression?: Yes Does the patient have breastfeeding experience prior to this delivery?: No  Feeding Feeding Type: Breast Fed Length of feed: 13 min  LATCH Score Latch: Repeated attempts needed to sustain latch, nipple held in mouth throughout feeding, stimulation needed to elicit sucking reflex.  Audible Swallowing: A few with stimulation  Type of Nipple: Flat  Comfort (Breast/Nipple): Filling, red/small blisters or bruises, mild/mod discomfort  Hold (Positioning): Assistance needed to correctly position infant at breast and maintain latch.  LATCH Score: 5  Interventions Interventions: Breast feeding basics reviewed;Assisted with latch;Hand express;Breast compression;Adjust position;Support pillows;Position options;Expressed milk;Comfort gels;DEBP  Lactation Tools  Discussed/Used Tools: Nipple Shields Nipple shield size: 20 WIC Program: No Pump Review: Setup, frequency, and cleaning;Milk Storage Initiated by:: ES Date initiated:: 10/03/16   Consult Status Consult Status: Follow-up Date: 10/04/16 Follow-up type: In-patient    Rulon Eisenmenger 10/03/2016, 11:01 AM

## 2016-10-03 NOTE — Progress Notes (Signed)
Haley Vang was referred for history of depression/anxiety. * Referral screened out by Clinical Social Worker because none of the following criteria appear to apply: ~ History of anxiety/depression during this pregnancy, or of post-partum depression. ~ Diagnosis of anxiety and/or depression within last 3 years OR * Haley Vang's symptoms currently being treated with medication and/or therapy. Haley Vang currently on Prozac managed by Haley Vang's PCP. NO concerns noted in OB records.   Please contact the Clinical Social Worker if needs arise, by Haley Vang request, or if Haley Vang scores 9 or greater/yes to question 10 on [place] Postpartum Depression Screen.  Audris Speaker Boyd-Gilyard, MSW, LCSW Clinical Social Work [phone] 

## 2016-10-04 MED ORDER — OXYCODONE-ACETAMINOPHEN 5-325 MG PO TABS: 1.0000 | ORAL_TABLET | ORAL | 0 refills | Status: DC | PRN

## 2016-10-04 NOTE — Discharge Summary (Signed)
Obstetric Discharge Summary Reason for Admission: onset of labor Prenatal Procedures: none Intrapartum Procedures: spontaneous vaginal delivery Postpartum Procedures: none Complications-Operative and Postpartum: 1 degree perineal laceration Hemoglobin  Date Value Ref Range Status  10/03/2016 10.3 (L) 12.0 - 15.0 g/dL Final   HCT  Date Value Ref Range Status  10/03/2016 29.7 (L) 36.0 - 46.0 % Final    Physical Exam:  General: alert, cooperative, appears stated age and no distress Lochia: appropriate Uterine Fundus: firm Incision: healing well DVT Evaluation: No evidence of DVT seen on physical exam.  Discharge Diagnoses: Term Pregnancy-delivered  Discharge Information: Date: 10/04/2016 Activity: pelvic rest Diet: routine Medications: Percocet Condition: stable Instructions: refer to practice specific booklet Discharge to: home   Newborn Data: Live born female  Birth Weight: 8 lb 5.9 oz (3796 g) APGAR: 9, 9  Home with mother.  Haley Vang C 10/04/2016, 9:32 AM

## 2016-10-04 NOTE — Lactation Note (Signed)
This note was copied from a baby's chart. Lactation Consultation Note  Patient Name: Haley Vang [id number] Date: 10/04/2016 Reason for consult: Follow-up assessment  Baby 18 hours old .  LC consult started at 0900 , and the last time baby had fed was 0500 - 20 mins per mom.  Per mom it has seemed like the baby has been using me like a pacifier and cluster feeding  All night.  LC 1st assessed breast tissue with mom's permission and noted a positional strip on the right nipple Semi compressible,( some swelling ). On the left areola edema noted and semi compressible areola.  LC reviewed hand expressing , and reverse pressure, areolas more compressible.  LC unable to express milk either breast.  LC assisted with latch on the left breast/ cross cradle/ depth , sluggish latch noted/ and when the baby Did get into a feeding pattern, few swallows noted, and mom became uncomfortable and asked to take The baby off. LC resized the NS and the #20 NS was to small , #24 NS fit well.  Baby latched with depth with the #24 NS and the sucking pattern less sluggish, fed for short time,  And got sleepy. [place] suspects due no let down , the baby is getting sleepy.  After attempts at the breast, offered a DEBP and the time to pump. [place] set up the DEBP and mom pumped both  Breast for 15 mins , with no  results. The # 24 [place] was comfortable.  LC highly recommended due to being 5 hours since the baby fed and the baby showing hunger cues and several  Attempts breast feeding and still hungry that baby needed to be supplemented with formula.  While mom held the baby LC finger fed the baby 10 ml with a syringe. And then Applied the #24 NS , instilled the 65m of  Formula in the top of the NS and the baby fed for 10 mins.and was satisfied.  LC stressed to mom and dad the importance of making use the baby's calories needs are met .  LC noted mom to be very exhausted and suspect her milk is letting down, also has sore  nipples bilaterally.  LC instructed mom on the use breast shells , reviewed application of the NS, and stressed the #24 NS fit the best.  LC reviewed sore nipple and engorgement prevention and tx.  LC showed mom how to use her Spectra DEBP , ( couldn't use at consult due to not having the tubing )  Had to set up the [place]   LC offered mom and LC O/P appt. To be set up before D/C and both mom / dad/ requested to be able to call Back for appt.  Mom has the phone # .   Mother informed of post-discharge support and given phone number to the lactation department, including services for phone call assistance; out-patient appointments; and breastfeeding support group. List of other breastfeeding resources in the community given in the handout. Encouraged mother to call for problems or concerns related to breastfeeding.    Maternal Data Has patient been taught Hand Expression?: Yes (able to express any milk , either breast )  Feeding Feeding Type: Breast Fed Length of feed: 5 min  LATCH Score Latch: Repeated attempts needed to sustain latch, nipple held in mouth throughout feeding, stimulation needed to elicit sucking reflex.  Audible Swallowing: A few with stimulation  Type of Nipple: Everted at rest and after stimulation (semi compressible  areolas , better after pre-pumping )  Comfort (Breast/Nipple): Soft / non-tender  Hold (Positioning): Assistance needed to correctly position infant at breast and maintain latch.  LATCH Score: 7  Interventions Interventions: Breast feeding basics reviewed;Assisted with latch;Skin to skin;Breast massage;Hand express;Pre-pump if needed;Reverse pressure;Breast compression;Adjust position;Support pillows;Position options  Lactation Tools Discussed/Used Tools: Shells;Pump;Comfort gels;Nipple Shields Nipple shield size: 24;20;Other (comment) (#24 NS is the better fit ) Shell Type: Inverted Breast pump type: Double-Electric Breast  Pump;Manual Pump Review: Setup, frequency, and cleaning   Consult Status Consult Status: Follow-up Date: 10/04/16 Follow-up type: In-patient    [place] 10/04/2016, 10:45 AM

## 2016-10-05 ENCOUNTER — Inpatient Hospital Stay (HOSPITAL_COMMUNITY): Admit: 2016-10-05 | Payer: BLUE CROSS/BLUE SHIELD

## 2017-03-05 ENCOUNTER — Encounter (HOSPITAL_COMMUNITY): Payer: Self-pay | Admitting: Emergency Medicine

## 2017-03-05 ENCOUNTER — Other Ambulatory Visit: Payer: Self-pay

## 2017-03-05 ENCOUNTER — Emergency Department (HOSPITAL_COMMUNITY)
Admission: EM | Admit: 2017-03-05 | Discharge: 2017-03-05 | Discharge status: Against Medical Advice | Payer: BC Managed Care – PPO | Source: Home / Self Care

## 2017-03-05 DIAGNOSIS — R1031 Right lower quadrant pain: Secondary | ICD-10-CM | POA: Insufficient documentation

## 2017-03-05 DIAGNOSIS — J45909 Unspecified asthma, uncomplicated: Secondary | ICD-10-CM | POA: Diagnosis not present

## 2017-03-05 DIAGNOSIS — Z79899 Other long term (current) drug therapy: Secondary | ICD-10-CM | POA: Insufficient documentation

## 2017-03-05 LAB — COMPREHENSIVE METABOLIC PANEL
ALK PHOS: 76 U/L (ref 38–126)
ALT: 30 U/L (ref 14–54)
AST: 29 U/L (ref 15–41)
Albumin: 4 g/dL (ref 3.5–5.0)
Anion gap: 9 (ref 5–15)
BUN: 10 mg/dL (ref 6–20)
CHLORIDE: 105 mmol/L (ref 101–111)
CO2: 24 mmol/L (ref 22–32)
CREATININE: 0.75 mg/dL (ref 0.44–1.00)
Calcium: 9.2 mg/dL (ref 8.9–10.3)
GFR calc Af Amer: >60 mL/min (ref >60)
GFR calc non Af Amer: >60 mL/min (ref >60)
GLUCOSE: 109 mg/dL — AB (ref 65–99)
Potassium: 3.6 mmol/L (ref 3.5–5.1)
SODIUM: 138 mmol/L (ref 135–145)
Total Bilirubin: 0.5 mg/dL (ref 0.3–1.2)
Total Protein: 7.7 g/dL (ref 6.5–8.1)

## 2017-03-05 LAB — CBC
HCT: 38.5 % (ref 36.0–46.0)
Hemoglobin: 13 g/dL (ref 12.0–15.0)
MCH: 28.1 pg (ref 26.0–34.0)
MCHC: 33.8 g/dL (ref 30.0–36.0)
MCV: 83.2 fL (ref 78.0–100.0)
Platelets: 304 10*3/uL (ref 150–400)
RBC: 4.63 MIL/uL (ref 3.87–5.11)
RDW: 14 % (ref 11.5–15.5)
WBC: 13.9 10*3/uL — ABNORMAL HIGH (ref 4.0–10.5)

## 2017-03-05 LAB — I-STAT BETA HCG BLOOD, ED (MC, WL, AP ONLY): I-stat hCG, quantitative: <5 m[IU]/mL (ref <5)

## 2017-03-05 LAB — LIPASE, BLOOD: LIPASE: 17 U/L (ref 11–51)

## 2017-03-05 MED ORDER — ONDANSETRON 4 MG PO TBDP
4.0000 mg | ORAL_TABLET | Freq: Once | ORAL | Status: AC | PRN
  Administered 2017-03-05: 4 mg via ORAL
  Filled 2017-03-05: qty 1

## 2017-03-05 NOTE — ED Triage Notes (Signed)
Pt states today around 2pm she started having lower abd pain that was dull stabbing and increased to sharp stabbing  Pt went to the ED in [place] and left there and went to the urgent care  States they did blood work and her WBC was 13.4 so they sent her here for further evaluation  Pt is c/o nausea, denies vomiting or diarrhea

## 2017-03-06 ENCOUNTER — Emergency Department (HOSPITAL_COMMUNITY)
Admission: EM | Admit: 2017-03-06 | Discharge: 2017-03-06 | Disposition: A | Discharge status: Home / Self Care | Payer: BC Managed Care – PPO | Attending: Emergency Medicine | Admitting: Emergency Medicine

## 2017-03-06 ENCOUNTER — Emergency Department (HOSPITAL_COMMUNITY): Payer: BC Managed Care – PPO

## 2017-03-06 ENCOUNTER — Encounter (HOSPITAL_COMMUNITY): Payer: Self-pay

## 2017-03-06 DIAGNOSIS — R1031 Right lower quadrant pain: Secondary | ICD-10-CM

## 2017-03-06 MED ORDER — IOPAMIDOL (ISOVUE-300) INJECTION 61%
INTRAVENOUS | Status: AC
  Administered 2017-03-06: 100 mL via INTRAVENOUS
  Filled 2017-03-06: qty 100

## 2017-03-06 MED ORDER — IBUPROFEN 600 MG PO TABS: 600.0000 mg | ORAL_TABLET | Freq: Four times a day (QID) | ORAL | 0 refills | Status: DC | PRN

## 2017-03-06 MED ORDER — HYDROMORPHONE HCL 1 MG/ML IJ SOLN
1.0000 mg | Freq: Once | INTRAMUSCULAR | Status: AC
  Administered 2017-03-06: 1 mg via INTRAVENOUS
  Filled 2017-03-06: qty 1

## 2017-03-06 MED ORDER — LACTATED RINGERS IV BOLUS (SEPSIS)
1000.0000 mL | Freq: Once | INTRAVENOUS | Status: AC
  Administered 2017-03-06: 1000 mL via INTRAVENOUS

## 2017-03-06 MED ORDER — IOPAMIDOL (ISOVUE-300) INJECTION 61%
100.0000 mL | Freq: Once | INTRAVENOUS | Status: AC | PRN
  Administered 2017-03-06: 100 mL via INTRAVENOUS

## 2017-03-06 MED ORDER — ONDANSETRON 4 MG PO TBDP: 4.0000 mg | ORAL_TABLET | Freq: Three times a day (TID) | ORAL | 0 refills | Status: DC | PRN

## 2017-03-06 NOTE — Discharge Instructions (Signed)
We saw you in the ER for the abdominal pain. °All the results in the ER are normal, labs and imaging. °We are not sure what is causing your symptoms. °The workup in the ER is not complete, and is limited to screening for life threatening and emergent conditions only, so please see a primary care doctor for further evaluation. ° °Please return to the ER if your symptoms worsen; you have increased pain, fevers, chills, inability to keep any medications down, confusion. Otherwise see the outpatient doctor as requested. ° °

## 2017-03-06 NOTE — ED Provider Notes (Signed)
[facility]-EMERGENCY DEPT Provider Note   CSN: [id number] Arrival date & time: 03/05/17  1905     History   Chief Complaint Chief Complaint  Patient presents with  . Abdominal Pain    HPI Haley Vang is a 27 y.o. female.  HPI  27 year old female with history of asthma comes in with chief complaint of abdominal pain.  Patient reports that around 2 PM after having her meal she started having lower abdominal pain.  The abdominal pain has been constant, initially it was dull but now it has become sharp.  Patient was seen at an urgent care and advised to come to the ER for further evaluation.  Patient's pain has been constant, and there is no associated dysuria, hematuria, urinary frequency, vaginal discharge, vaginal bleeding.  Patient does have nausea however she denies any vomiting or diarrhea.  Patient has no history of abdominal surgeries.  Patient is here with her husband, she denies any risk factors for STDs or history of STDs.  Past Medical History:  Diagnosis Date  . Anxiety   . Asthma    last used inhaler 1.5 months ago  . Depression   . Headache     Patient Active Problem List   Diagnosis Date Noted  . NSVD (normal spontaneous vaginal delivery) 10/02/2016  . Normal labor 10/01/2016    Past Surgical History:  Procedure Laterality Date  . WISDOM TOOTH EXTRACTION      OB History    Gravida Para Term Preterm AB Living   1 1 1     1    SAB TAB Ectopic Multiple Live Births         0 1       Home Medications    Prior to Admission medications   Medication Sig Start Date End Date Taking? Authorizing Provider  acetaminophen (TYLENOL) 500 MG tablet Take 1,000 mg by mouth every 6 (six) hours as needed. For pain    Yes [provider]  albuterol (PROVENTIL HFA;VENTOLIN HFA) 108 (90 Base) MCG/ACT inhaler Inhale 2 puffs into the lungs every 6 (six) hours as needed for wheezing or shortness of breath.   Yes [provider]    ARIPiprazole (ABILIFY) 2 MG tablet Take 2 mg by mouth daily.     Yes [provider]  ESTARYLLA 0.25-35 MG-MCG tablet Take 1 tablet by mouth daily. 01/31/17  Yes [provider]  FLUoxetine (PROZAC) 10 MG capsule Take 10 mg by mouth daily.     Yes [provider]  ibuprofen (ADVIL,MOTRIN) 600 MG tablet Take 1 tablet (600 mg total) by mouth every 6 (six) hours as needed. 03/06/17   Derwood Kaplan, MD  ondansetron (ZOFRAN-ODT) 4 MG disintegrating tablet Take 1 tablet (4 mg total) by mouth every 8 (eight) hours as needed for nausea. 03/06/17   Derwood Kaplan, MD    Family History Family History  Problem Relation Age of Onset  . Hypertension Mother   . Thyroid disease Mother   . Depression Father   . Hypertension Father   . Stroke Paternal Grandmother     Social History Social History   Tobacco Use  . Smoking status: Never Smoker  . Smokeless tobacco: Never Used  Substance Use Topics  . Alcohol use: No    Frequency: Never  . Drug use: No     Allergies   Ibuprofen   Review of Systems Review of Systems  Constitutional: Positive for activity change.  Gastrointestinal: Positive for abdominal pain.  All other systems reviewed and are negative.    Physical Exam Updated Vital Signs BP 109/76 (BP Location: Left Arm)   Pulse 89   Temp 98.1 F (36.7 C) (Oral)   Resp 16   Ht 5\' 5"  (1.651 m)   Wt 125.2 kg (276 lb)   LMP 02/27/2017 (Exact Date) Comment: negative beta HCG 03/05/17  SpO2 97%   BMI 45.93 kg/m   Physical Exam  Constitutional: She is oriented to person, place, and time. She appears well-developed.  HENT:  Head: Normocephalic and atraumatic.  Eyes: EOM are normal.  Neck: Normal range of motion. Neck supple.  Cardiovascular: Normal rate.  Pulmonary/Chest: Effort normal.  Abdominal: Bowel sounds are normal. There is tenderness in the right lower quadrant. There is tenderness at McBurney's point.  Neurological: She is alert and  oriented to person, place, and time.  Skin: Skin is warm and dry.  Nursing note and vitals reviewed.    ED Treatments / Results  Labs (all labs ordered are listed, but only abnormal results are displayed) Labs Reviewed  COMPREHENSIVE METABOLIC PANEL - Abnormal; Notable for the following components:      Result Value   Glucose, Bld 109 (*)    All other components within normal limits  CBC - Abnormal; Notable for the following components:   WBC 13.9 (*)    All other components within normal limits  LIPASE, BLOOD  URINALYSIS, ROUTINE W REFLEX MICROSCOPIC  I-STAT BETA HCG BLOOD, ED ([facility], [facility], [facility] ONLY)    EKG  EKG Interpretation None       Radiology Ct Abdomen Pelvis W Contrast  Result Date: 03/06/2017 CLINICAL DATA:  Abdominal pain EXAM: CT ABDOMEN AND PELVIS WITH CONTRAST TECHNIQUE: Multidetector CT imaging of the abdomen and pelvis was performed using the standard protocol following bolus administration of intravenous contrast. CONTRAST:  100 mL Isovue-300 COMPARISON:  None. FINDINGS: Lower chest: No pulmonary nodules or pleural effusion. No visible pericardial effusion. Hepatobiliary: Normal hepatic contours and density. No visible biliary dilatation. Normal gallbladder. Pancreas: Normal contours without ductal dilatation. No peripancreatic fluid collection. Spleen: Normal. Adrenals/Urinary Tract: --Adrenal glands: Normal. --Right kidney/ureter: No hydronephrosis or perinephric stranding. No nephrolithiasis. No obstructing ureteral stones. --Left kidney/ureter: No hydronephrosis or perinephric stranding. No nephrolithiasis. No obstructing ureteral stones. --Urinary bladder: Unremarkable. Stomach/Bowel: --Stomach/Duodenum: No hiatal hernia or other gastric abnormality. Normal duodenal course and caliber. --Small bowel: No dilatation or inflammation. --Colon: No focal abnormality. --Appendix: Normal. Vascular/Lymphatic: Normal course and caliber of the major abdominal vessels. No abdominal  or pelvic lymphadenopathy. Reproductive: Normal uterus and ovaries. Musculoskeletal. No bony spinal canal stenosis or focal osseous abnormality. Other: None. IMPRESSION: No acute abnormality of the abdomen or pelvis.  Normal appendix. Electronically Signed   By: Deatra RobinsonKevin  Herman M.D.   On: 03/06/2017 03:47    Procedures Procedures (including critical care time)  Medications Ordered in ED Medications  lactated ringers bolus 1,000 mL (1,000 mLs Intravenous New Bag/Given 03/06/17 0409)  ondansetron (ZOFRAN-ODT) disintegrating tablet 4 mg (4 mg Oral Given 03/05/17 1936)  HYDROmorphone (DILAUDID) injection 1 mg (1 mg Intravenous Given 03/06/17 0331)  iopamidol (ISOVUE-300) 61 % injection 100 mL (100 mLs Intravenous Contrast Given 03/06/17 0326)     Initial Impression / Assessment and Plan / ED Course  I have reviewed the triage vital signs and the nursing notes.  Pertinent labs & imaging results that were available during my care of the patient were reviewed by me and considered in my medical decision making (see chart  for details).  Clinical Course as of Mar 07 447  Tue Mar 06, 2017  8119 Results from the ER workup discussed with the patient face to face and all questions answered to the best of my ability.  Patient does not have appendicitis.  Reproductive organs include the ovaries look fine.  On reassessment, patient reports that her pain has improved significantly.  He only had gets pain now when the abdomen is palpated.  I informed her that we can get an ultrasound of her ovary to be sure that there is no ovarian torsion, however my suspicion for ovarian torsion is low if her pain is improved.  Patient refers the option of following up with a gynecologic physician at this time..  Strict ER return precautions have been discussed, and patient is agreeing with the plan and is comfortable with the workup done and the recommendations from the ER.  CT Abdomen Pelvis W Contrast [AN]    Clinical  Course User Index [AN] Derwood Kaplan, MD    27 year old patient comes in with chief complaint of abdominal pain.  On exam patient is having right lower quadrant tenderness with guarding.  Patient's pain started after a meal, differential diagnosis at this time includes cholelithiasis, ovarian torsion/cyst, appendicitis.  Patient does not have any UTI-like symptoms, flank pain therefore we do not think she is having cystitis or pyelonephritis.  Patient also does not have any vaginal discharge and is in a stable monogamous relationship with her husband and is not concern for STDs.  I doubt that patient is having PID.  CT scan of the abdomen has been ordered. We will reassess.  Final Clinical Impressions(s) / ED Diagnoses   Final diagnoses:  Right lower quadrant abdominal pain    ED Discharge Orders        Ordered    ibuprofen (ADVIL,MOTRIN) 600 MG tablet  Every 6 hours PRN     03/06/17 0448    ondansetron (ZOFRAN-ODT) 4 MG disintegrating tablet  Every 8 hours PRN     03/06/17 0448       Derwood Kaplan, MD 03/06/17 [phone]

## 2017-06-12 ENCOUNTER — Other Ambulatory Visit (HOSPITAL_COMMUNITY): Payer: Self-pay | Admitting: Sports Medicine

## 2017-06-12 DIAGNOSIS — G8929 Other chronic pain: Secondary | ICD-10-CM

## 2017-06-12 DIAGNOSIS — M25512 Pain in left shoulder: Principal | ICD-10-CM

## 2017-06-19 ENCOUNTER — Ambulatory Visit (HOSPITAL_COMMUNITY)
Admission: RE | Admit: 2017-06-19 | Discharge: 2017-06-19 | Disposition: A | Discharge status: Home / Self Care | Payer: BC Managed Care – PPO | Source: Ambulatory Visit | Attending: Sports Medicine | Admitting: Sports Medicine

## 2017-06-19 ENCOUNTER — Encounter (HOSPITAL_COMMUNITY): Payer: Self-pay

## 2017-06-19 DIAGNOSIS — G8929 Other chronic pain: Secondary | ICD-10-CM

## 2017-06-19 DIAGNOSIS — X58XXXA Exposure to other specified factors, initial encounter: Secondary | ICD-10-CM | POA: Diagnosis not present

## 2017-06-19 DIAGNOSIS — M75102 Unspecified rotator cuff tear or rupture of left shoulder, not specified as traumatic: Secondary | ICD-10-CM | POA: Diagnosis not present

## 2017-06-19 DIAGNOSIS — M25512 Pain in left shoulder: Secondary | ICD-10-CM | POA: Diagnosis present

## 2017-06-19 MED ORDER — GADOBENATE DIMEGLUMINE 529 MG/ML IV SOLN
5.0000 mL | Freq: Once | INTRAVENOUS | Status: AC | PRN
  Administered 2017-06-19: 0.05 mL via INTRA_ARTICULAR

## 2017-06-19 MED ORDER — POVIDONE-IODINE 10 % EX SOLN
CUTANEOUS | Status: AC
  Filled 2017-06-19: qty 15

## 2017-06-19 MED ORDER — IOPAMIDOL (ISOVUE-300) INJECTION 61%
INTRAVENOUS | Status: AC
  Administered 2017-06-19: 20 mL
  Filled 2017-06-19: qty 50

## 2017-06-19 MED ORDER — LIDOCAINE HCL (PF) 1 % IJ SOLN
INTRAMUSCULAR | Status: AC
  Administered 2017-06-19: 5 mL
  Filled 2017-06-19: qty 5

## 2017-06-19 NOTE — Procedures (Signed)
Preprocedure Dx: LT shoulder pain Postprocedure Dx: LEFT shoulder pain Procedure  Fluoroscopically guided LEFT shoulder joint injection for MR arthrogrpahy Radiologist:  Tyron Russell Anesthesia:  5 ml of 1% lidocaine Injectate:  8 ml of [0.05 ml Multihance in 20 ml Isovue-300] Fluoro time:  1 minutes 6 seconds EBL:   < 1 ml Complications: None

## 2017-07-24 LAB — OB RESULTS CONSOLE HIV ANTIBODY (ROUTINE TESTING): HIV: NONREACTIVE

## 2017-07-24 LAB — OB RESULTS CONSOLE RUBELLA ANTIBODY, IGM: RUBELLA: IMMUNE

## 2017-07-24 LAB — OB RESULTS CONSOLE RPR: RPR: NONREACTIVE

## 2017-07-24 LAB — OB RESULTS CONSOLE GC/CHLAMYDIA
Chlamydia: NEGATIVE
GC PROBE AMP, GENITAL: NEGATIVE

## 2017-07-24 LAB — OB RESULTS CONSOLE ABO/RH: RH TYPE: POSITIVE

## 2017-07-24 LAB — OB RESULTS CONSOLE HEPATITIS B SURFACE ANTIGEN: Hepatitis B Surface Ag: NEGATIVE

## 2017-07-24 LAB — OB RESULTS CONSOLE ANTIBODY SCREEN: Antibody Screen: NEGATIVE

## 2017-11-01 ENCOUNTER — Encounter (HOSPITAL_COMMUNITY): Payer: Self-pay

## 2017-11-01 ENCOUNTER — Inpatient Hospital Stay (HOSPITAL_COMMUNITY)
Admission: AD | Admit: 2017-11-01 | Discharge: 2017-11-01 | Disposition: A | Discharge status: Home / Self Care | Payer: BC Managed Care – PPO | Source: Ambulatory Visit | Attending: Obstetrics & Gynecology | Admitting: Obstetrics & Gynecology

## 2017-11-01 DIAGNOSIS — O26899 Other specified pregnancy related conditions, unspecified trimester: Secondary | ICD-10-CM

## 2017-11-01 DIAGNOSIS — Z3A23 23 weeks gestation of pregnancy: Secondary | ICD-10-CM | POA: Insufficient documentation

## 2017-11-01 DIAGNOSIS — R102 Pelvic and perineal pain unspecified side: Secondary | ICD-10-CM

## 2017-11-01 DIAGNOSIS — O26892 Other specified pregnancy related conditions, second trimester: Secondary | ICD-10-CM

## 2017-11-01 DIAGNOSIS — R109 Unspecified abdominal pain: Secondary | ICD-10-CM | POA: Diagnosis not present

## 2017-11-01 DIAGNOSIS — Z3689 Encounter for other specified antenatal screening: Secondary | ICD-10-CM

## 2017-11-01 DIAGNOSIS — O2342 Unspecified infection of urinary tract in pregnancy, second trimester: Secondary | ICD-10-CM | POA: Insufficient documentation

## 2017-11-01 LAB — URINALYSIS, ROUTINE W REFLEX MICROSCOPIC
BILIRUBIN URINE: NEGATIVE
Glucose, UA: NEGATIVE mg/dL
HGB URINE DIPSTICK: NEGATIVE
KETONES UR: NEGATIVE mg/dL
Nitrite: NEGATIVE
PH: 7 (ref 5.0–8.0)
Protein, ur: NEGATIVE mg/dL
Specific Gravity, Urine: 1.005 (ref 1.005–1.030)
WBC, UA: >50 WBC/hpf — ABNORMAL HIGH (ref 0–5)

## 2017-11-01 MED ORDER — CEPHALEXIN 500 MG PO CAPS: 500.0000 mg | ORAL_CAPSULE | Freq: Four times a day (QID) | ORAL | 0 refills | Status: DC

## 2017-11-01 MED ORDER — CYCLOBENZAPRINE HCL 10 MG PO TABS
10.0000 mg | ORAL_TABLET | Freq: Once | ORAL | Status: AC
  Administered 2017-11-01: 10 mg via ORAL
  Filled 2017-11-01: qty 1

## 2017-11-01 NOTE — MAU Note (Signed)
Pt states she's been having severe pelvic pain since yesterday, 6/10. Has tried tylenol, rest, etc. Called the office today and lunch and got no return call, called again, but they were closed, so came here. No bleeding or LOF, + FM

## 2017-11-01 NOTE — Discharge Instructions (Signed)

## 2017-11-01 NOTE — MAU Provider Note (Signed)
History     CSN: [id number]  Arrival date and time: 11/01/17 1633   First Provider Initiated Contact with Patient 11/01/17 1719      Chief Complaint  Patient presents with  . severe pelvic pain   HPI  Haley Vang is a 27 y.o. G2P1001 at [redacted]w[redacted]d who presents to MAU with chief complaint of abdominal pain. This is a new problem, onset this week and worsening this morning. Endorses bilateral low abdominal discomfort, rated as 5/10, does not radiate, aggravated by position changes, walking for long periods of time. Denies alleviating factors but has been managing with Tylenol at home.  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   Denies vaginal bleeding, leaking of fluid, decreased fetal movement, fever, falls, or recent illness.  Denies urinary symptoms.  OB History    Gravida  2   Para  1   Term  1   Preterm      AB      Living  1     SAB      TAB      Ectopic      Multiple  0   Live Births  1           Past Medical History:  Diagnosis Date  . Anxiety   . Asthma    last used inhaler 1.5 months ago  . Depression   . Headache     Past Surgical History:  Procedure Laterality Date  . WISDOM TOOTH EXTRACTION      Family History  Problem Relation Age of Onset  . Hypertension Mother   . Thyroid disease Mother   . Depression Father   . Hypertension Father   . Stroke Paternal Grandmother     Social History   Tobacco Use  . Smoking status: Never Smoker  . Smokeless tobacco: Never Used  Substance Use Topics  . Alcohol use: No    Frequency: Never  . Drug use: No    Allergies:  Allergies  Allergen Reactions  . Ibuprofen Other (See Comments)    Causes bleeding. Has taken and had no problems     Medications Prior to Admission  Medication Sig Dispense Refill Last Dose  . acetaminophen (TYLENOL) 500 MG tablet Take 1,000 mg by mouth every 6 (six) hours as  needed. For pain    03/05/2017 at Unknown time  . albuterol (PROVENTIL HFA;VENTOLIN HFA) 108 (90 Base) MCG/ACT inhaler Inhale 2 puffs into the lungs every 6 (six) hours as needed for wheezing or shortness of breath.   unknown at Unknown time  . ARIPiprazole (ABILIFY) 2 MG tablet Take 2 mg by mouth daily.     03/05/2017 at Unknown time  . ESTARYLLA 0.25-35 MG-MCG tablet Take 1 tablet by mouth daily.  12 03/05/2017 at Unknown time  . FLUoxetine (PROZAC) 10 MG capsule Take 10 mg by mouth daily.     03/05/2017 at Unknown time  . ibuprofen (ADVIL,MOTRIN) 600 MG tablet Take 1 tablet (600 mg total) by mouth every 6 (six) hours as needed. 30 tablet 0   . ondansetron (ZOFRAN-ODT) 4 MG disintegrating tablet Take 1 tablet (4 mg total) by mouth every 8 (eight) hours as needed for nausea. 30 tablet 0     Review of Systems  Constitutional: Negative for fatigue.  Respiratory: Negative for shortness of breath.   Gastrointestinal: Positive for abdominal pain.  Genitourinary: Negative for  vaginal bleeding, vaginal discharge and vaginal pain.  Musculoskeletal: Negative for back pain.  Neurological: Negative for dizziness and headaches.  All other systems reviewed and are negative.  Physical Exam   Blood pressure 118/71, pulse 93, temperature 98.1 F (36.7 C), temperature source Oral, resp. rate 16, height 5\' 5"  (1.651 m), weight 117.9 kg, last menstrual period 05/31/2017, SpO2 100 %, unknown if currently breastfeeding.  Physical Exam  Nursing note and vitals reviewed. Constitutional: She is oriented to person, place, and time. She appears well-developed and well-nourished.  Cardiovascular: Normal rate, normal heart sounds and intact distal pulses.  Respiratory: Effort normal. No respiratory distress.  GI: She exhibits no distension and no mass. There is no tenderness. There is no rebound, no guarding and no CVA tenderness.  Gravid  Genitourinary: Vagina normal and uterus normal.  Genitourinary Comments:  Cervix closed/thick/ballotable  Musculoskeletal: Normal range of motion.  Neurological: She is alert and oriented to person, place, and time. She has normal reflexes.  Skin: Skin is warm and dry.  Psychiatric: She has a normal mood and affect. Her behavior is normal. Judgment and thought content normal.    MAU Course  Procedures  MDM Reactive fetal tracing: 145, moderate variability, positive acceleration, no decelerations Toco: quiet Closed cervix Pain unrelieved by Flexeril given in MAU  Patient Vitals for the past 24 hrs:  BP Temp Temp src Pulse Resp SpO2 Height Weight  11/01/17 1824 118/71 - - 93 - - - -  11/01/17 1823 - - - - 16 - - -  11/01/17 1716 137/80 - - (!) 107 - - - -  11/01/17 1714 - 98.1 F (36.7 C) Oral - 16 100 % 5\' 5"  (1.651 m) 117.9 kg    Orders Placed This Encounter  Procedures  . Urine culture  . Urinalysis, Routine w reflex microscopic  . Discharge patient   Results for orders placed or performed during the hospital encounter of 11/01/17 (from the past 24 hour(s))  Urinalysis, Routine w reflex microscopic     Status: Abnormal   Collection Time: 11/01/17  5:36 PM  Result Value Ref Range   Color, Urine YELLOW YELLOW   APPearance HAZY (A) CLEAR   Specific Gravity, Urine 1.005 1.005 - 1.030   pH 7.0 5.0 - 8.0   Glucose, UA NEGATIVE NEGATIVE mg/dL   Hgb urine dipstick NEGATIVE NEGATIVE   Bilirubin Urine NEGATIVE NEGATIVE   Ketones, ur NEGATIVE NEGATIVE mg/dL   Protein, ur NEGATIVE NEGATIVE mg/dL   Nitrite NEGATIVE NEGATIVE   Leukocytes, UA LARGE (A) NEGATIVE   RBC / HPF 0-5 0 - 5 RBC/hpf   WBC, UA >50 (H) 0 - 5 WBC/hpf   Bacteria, UA FEW (A) NONE SEEN   Squamous Epithelial / LPF 6-10 0 - 5   Amorphous Crystal PRESENT    Meds ordered this encounter  Medications  . cyclobenzaprine (FLEXERIL) tablet 10 mg  . cephALEXin (KEFLEX) 500 MG capsule    Sig: Take 1 capsule (500 mg total) by mouth 4 (four) times daily.    Dispense:  40 capsule    Refill:   0    Order Specific Question:   Supervising Provider    Answer:   Reva BoresPRATT, TANYA S [[id number]]   Assessment and Plan  --27 y.o. G2P1001 at 749w3d  --Reactive fetal tracing --Closed cervix --Round ligament pain. Discussed maternity belt, side-lying with body pillow, floating in pool  --UTI management with Keflex based on UA results. Urine culture ordered, will follow up PRN. Discussed  with patient that she    can elect to wait for urine culture results. She verbalized desire to initiate rx now --Discharge home in stable condition  F/U: Patient has OB appt scheduled for Monday 11/05/17  Calvert Cantor, CNM 11/01/2017, 6:46 PM

## 2017-11-03 LAB — URINE CULTURE
Culture: <10000 — AB
SPECIAL REQUESTS: NORMAL

## 2017-11-18 ENCOUNTER — Encounter (HOSPITAL_COMMUNITY): Payer: Self-pay | Admitting: *Deleted

## 2017-11-18 ENCOUNTER — Inpatient Hospital Stay (HOSPITAL_COMMUNITY)
Admission: AD | Admit: 2017-11-18 | Discharge: 2017-11-18 | Disposition: A | Discharge status: Home / Self Care | Payer: BC Managed Care – PPO | Source: Ambulatory Visit | Attending: Obstetrics and Gynecology | Admitting: Obstetrics and Gynecology

## 2017-11-18 ENCOUNTER — Other Ambulatory Visit: Payer: Self-pay

## 2017-11-18 DIAGNOSIS — O99512 Diseases of the respiratory system complicating pregnancy, second trimester: Secondary | ICD-10-CM | POA: Insufficient documentation

## 2017-11-18 DIAGNOSIS — N949 Unspecified condition associated with female genital organs and menstrual cycle: Secondary | ICD-10-CM

## 2017-11-18 DIAGNOSIS — J45909 Unspecified asthma, uncomplicated: Secondary | ICD-10-CM | POA: Insufficient documentation

## 2017-11-18 DIAGNOSIS — Z3A25 25 weeks gestation of pregnancy: Secondary | ICD-10-CM | POA: Diagnosis not present

## 2017-11-18 DIAGNOSIS — F419 Anxiety disorder, unspecified: Secondary | ICD-10-CM | POA: Diagnosis not present

## 2017-11-18 DIAGNOSIS — Z886 Allergy status to analgesic agent status: Secondary | ICD-10-CM | POA: Diagnosis not present

## 2017-11-18 DIAGNOSIS — Z79899 Other long term (current) drug therapy: Secondary | ICD-10-CM | POA: Insufficient documentation

## 2017-11-18 DIAGNOSIS — O26892 Other specified pregnancy related conditions, second trimester: Secondary | ICD-10-CM | POA: Diagnosis present

## 2017-11-18 DIAGNOSIS — R102 Pelvic and perineal pain: Secondary | ICD-10-CM | POA: Insufficient documentation

## 2017-11-18 DIAGNOSIS — Z818 Family history of other mental and behavioral disorders: Secondary | ICD-10-CM | POA: Insufficient documentation

## 2017-11-18 DIAGNOSIS — O99342 Other mental disorders complicating pregnancy, second trimester: Secondary | ICD-10-CM | POA: Insufficient documentation

## 2017-11-18 DIAGNOSIS — Z9889 Other specified postprocedural states: Secondary | ICD-10-CM | POA: Diagnosis not present

## 2017-11-18 LAB — URINALYSIS, ROUTINE W REFLEX MICROSCOPIC
Bilirubin Urine: NEGATIVE
GLUCOSE, UA: NEGATIVE mg/dL
HGB URINE DIPSTICK: NEGATIVE
KETONES UR: NEGATIVE mg/dL
LEUKOCYTES UA: NEGATIVE
Nitrite: NEGATIVE
PH: 7 (ref 5.0–8.0)
PROTEIN: NEGATIVE mg/dL
Specific Gravity, Urine: 1.013 (ref 1.005–1.030)

## 2017-11-18 NOTE — MAU Provider Note (Signed)
History     CSN: [id number]  Arrival date and time: 11/18/17 2107   First Provider Initiated Contact with Patient 11/18/17 2228      Chief Complaint  Patient presents with  . Pelvic Pain   HPI  Ms.  Haley Vang is a 27 y.o. year old G70P1001 female at [redacted]w[redacted]d weeks gestation who presents to MAU reporting pelvic pressure x 3 days that is worse today. She reports it worsens with standing, coughing and urinating. She denies UTI sx's. She reports the pain as occasional stabbing type pain in abdomen. She denies constipation, diarrhea, VB, or vaginal discharge. She reports good (+) FM. She was seen here recently for pelvic pain and she says the pain is the same. She is scheduled to start PT next week. She also reports dyspareunia a few days ago.  Past Medical History:  Diagnosis Date  . Anxiety   . Asthma    last used inhaler 1.5 months ago  . Depression   . Headache     Past Surgical History:  Procedure Laterality Date  . WISDOM TOOTH EXTRACTION      Family History  Problem Relation Age of Onset  . Hypertension Mother   . Thyroid disease Mother   . Depression Father   . Hypertension Father   . Stroke Paternal Grandmother     Social History   Tobacco Use  . Smoking status: Never Smoker  . Smokeless tobacco: Never Used  Substance Use Topics  . Alcohol use: No    Frequency: Never  . Drug use: No    Allergies:  Allergies  Allergen Reactions  . Ibuprofen Other (See Comments)    Causes bleeding. Has taken and had no problems     Medications Prior to Admission  Medication Sig Dispense Refill Last Dose  . ARIPiprazole (ABILIFY) 2 MG tablet Take 2 mg by mouth daily.     11/18/2017 at Unknown time  . cephALEXin (KEFLEX) 500 MG capsule Take 1 capsule (500 mg total) by mouth 4 (four) times daily. 40 capsule 0 11/18/2017 at Unknown time  . FLUoxetine (PROZAC) 10 MG capsule Take 10 mg by mouth daily.     11/18/2017 at Unknown time  . Prenatal Vit-Fe Fumarate-FA  (MULTIVITAMIN-PRENATAL) 27-0.8 MG TABS tablet Take 1 tablet by mouth daily at 12 noon.     Marland Kitchen acetaminophen (TYLENOL) 500 MG tablet Take 1,000 mg by mouth every 6 (six) hours as needed. For pain    03/05/2017 at Unknown time  . albuterol (PROVENTIL HFA;VENTOLIN HFA) 108 (90 Base) MCG/ACT inhaler Inhale 2 puffs into the lungs every 6 (six) hours as needed for wheezing or shortness of breath.   unknown at Unknown time         . ondansetron (ZOFRAN-ODT) 4 MG disintegrating tablet Take 1 tablet (4 mg total) by mouth every 8 (eight) hours as needed for nausea. 30 tablet 0     Review of Systems  Constitutional: Negative.   HENT: Negative.   Eyes: Negative.   Respiratory: Negative.   Cardiovascular: Negative.   Gastrointestinal: Negative.   Endocrine: Negative.   Genitourinary: Positive for dysuria ("pressure with urination") and pelvic pain (increased pelvic pressure that's different than the symphysis pubis pain").  Musculoskeletal: Negative.   Skin: Negative.   Allergic/Immunologic: Negative.   Neurological: Negative.   Hematological: Negative.   Psychiatric/Behavioral: Negative.    Physical Exam   Blood pressure 132/77, pulse 91, temperature 98 F (36.7 C), temperature source Oral, resp. rate 16, weight 128.4  kg, last menstrual period 05/31/2017, SpO2 100 %.  Physical Exam  Nursing note and vitals reviewed. Constitutional: She appears well-developed and well-nourished.  HENT:  Head: Normocephalic and atraumatic.  Eyes: Pupils are equal, round, and reactive to light.  Neck: Normal range of motion.  Cardiovascular: Normal rate and regular rhythm.  Respiratory: Effort normal.  GI: Soft.  Genitourinary:  Genitourinary Comments: Dilation: Closed Effacement (%): Thick Cervical Position: Posterior Station: Ballotable Presentation: Vertex Exam by:: Carloyn Jaeger, CNM   Musculoskeletal: Normal range of motion.  Neurological: She is alert.  Skin: Skin is warm and dry.  Psychiatric: She  has a normal mood and affect. Her behavior is normal. Judgment and thought content normal.    MAU Course  Procedures  MDM CCUA NST - FHR: 150 bpm / moderate variability / accels present / decels absent / TOCO: UC's every 3 mins at beginning of tracing, but none now   *Consult with Dr. Erin Fulling @ 2306 - notified of patient's complaints, assessments, lab & NST results, recommended tx plan d/c home, F/U with [facility] next week  Results for orders placed or performed during the hospital encounter of 11/18/17 (from the past 24 hour(s))  Urinalysis, Routine w reflex microscopic     Status: None   Collection Time: 11/18/17  9:40 PM  Result Value Ref Range   Color, Urine YELLOW YELLOW   APPearance CLEAR CLEAR   Specific Gravity, Urine 1.013 1.005 - 1.030   pH 7.0 5.0 - 8.0   Glucose, UA NEGATIVE NEGATIVE mg/dL   Hgb urine dipstick NEGATIVE NEGATIVE   Bilirubin Urine NEGATIVE NEGATIVE   Ketones, ur NEGATIVE NEGATIVE mg/dL   Protein, ur NEGATIVE NEGATIVE mg/dL   Nitrite NEGATIVE NEGATIVE   Leukocytes, UA NEGATIVE NEGATIVE    Assessment and Plan  Pelvic pressure in pregnancy, antepartum, second trimester - Plan: Discharge patient - Advised to wear maternity belt all day excluding shower - Information provided on abd pain in pregnancy - Keep PT appt as scheduled - F/U with [facility] next week  - Patient verbalized an understanding of the plan of care and agrees.    Raelyn Mora, MSN, CNM 11/18/2017, 10:28 PM

## 2017-11-18 NOTE — MAU Note (Addendum)
Pt reports pelvic pressure for 3 days. Pain much worse today. Pressure and pain increases with standing, coughing and when she urinates, but it does not feel like a UTI. Some occasional stabbing type pain in abdomen. Denies constipation or diarrhea. Denies bleeding or discharge. + FM. Pt was seen for pelvic pain a couple of weeks ago, that pain is still present and now she has this pain in addition to the other pain. Pt to start PT next week

## 2017-11-18 NOTE — Discharge Instructions (Signed)
WEAR MATERNITY BELT ALL DAY (EXCLUDING SHOWERING).

## 2017-11-18 NOTE — MAU Note (Signed)
Urine in lab 

## 2017-11-26 ENCOUNTER — Telehealth (HOSPITAL_COMMUNITY): Payer: Self-pay | Admitting: Lactation Services

## 2017-11-26 NOTE — Telephone Encounter (Signed)
Called mom to set up a meeting with myself and Threasa Alpha, RN, IBCLC at [employer] request to talk over BF prior to birth of infant. Mom available next week after 4:15 pm. Will call mom back tomorrow with a date and time for meeting, mom voiced understanding.

## 2017-11-27 ENCOUNTER — Telehealth (HOSPITAL_COMMUNITY): Payer: Self-pay | Admitting: Lactation Services

## 2017-11-27 NOTE — Telephone Encounter (Signed)
Called mom to offer her a meeting October 23 at 4:15 pm. Asked mom to come to [facility] and I will meet her there. Mom reports that will work for her.

## 2017-12-27 ENCOUNTER — Telehealth (HOSPITAL_COMMUNITY): Payer: Self-pay | Admitting: Lactation Services

## 2017-12-27 NOTE — Telephone Encounter (Signed)
Mom called and reports she is 31.5 weeks and is leaking colostrum. Discussed with mom this is normal. Mom asked about starting hand expression, discussed this is not advisable until 37.5-38 weeks due to risk of inducing contractions. Mom reports she is measuring big and is to have an US around 36 weeks to determine size and discuss induction vs C/S. Mom reports all questions have been answered at this time and is to call back as needed.

## 2017-12-27 NOTE — Telephone Encounter (Signed)
Met with Haley Vang along with [name], RN at request of [name], Director and [name] from Patient Experience. [name] had some concerns about how BF went with her older child and is now expecting her second child.   Mom reports infant was hospitalized at a young age for UTI and at that time she did not keep up with her pumping. Infant was being supplemented in the hospital due to delayed Lactogenesis 2 and weight loss. She was not able to recover he milk supply and infant continued to be supplemented with formula. Mom reports she was offered services in the hospital and reports she did not always understand why certain things were being done (Nipple Shield, formula etc).   Mom wanted to discuss ways that she can ensure a smoother breast feeding journey with her second child. We reviewed BF basics, importance of good latch, frequent breast emptying and stimulation, good follow up support and weight checks and indications for supplementation in the breast fed infant.   Mom is taking Wellbutrin and she and I spoke in OP after her first child about that medication, she was informed there are some mothers who do not make a full milk supply when taking Wellbutrin, mom reports she has tried many medications and this is the best fit for her. She did have her Prolactin levels checked by her OB and was told they were normal.   We discussed importance of hand expression, STS, and feeding with cues in the early days. We did discuss adding early pumping once the infant is born to help stimulate a milk supply. Discussed after mom and infant get settled a DEBP can be set up for use in her room for use. We also discussed prenatal hand expression around 38 weeks to collect, freeze and bring colostrum to the hospital to use for the infant after birth to use for supplementation if needed. Mom was given colostrum collections containers and the [employer] web site to practice hand expression.   [name] and I  discussed with mom that we would be glad to stop by and see her in the hospital and help her with a feeding plan to help get breast feeding off to a good start. Mom voiced appreciation. Mom was given Lactation handout and phone #'s to call with any further questions/concerns as needed.

## 2018-01-21 ENCOUNTER — Inpatient Hospital Stay (HOSPITAL_COMMUNITY)
Admission: AD | Admit: 2018-01-21 | Discharge: 2018-01-22 | Disposition: A | Discharge status: Home / Self Care | Payer: BC Managed Care – PPO | Attending: Obstetrics and Gynecology | Admitting: Obstetrics and Gynecology

## 2018-01-21 ENCOUNTER — Encounter (HOSPITAL_COMMUNITY): Payer: Self-pay | Admitting: *Deleted

## 2018-01-21 DIAGNOSIS — O26893 Other specified pregnancy related conditions, third trimester: Secondary | ICD-10-CM

## 2018-01-21 DIAGNOSIS — N949 Unspecified condition associated with female genital organs and menstrual cycle: Secondary | ICD-10-CM

## 2018-01-21 DIAGNOSIS — Z0371 Encounter for suspected problem with amniotic cavity and membrane ruled out: Secondary | ICD-10-CM | POA: Diagnosis present

## 2018-01-21 DIAGNOSIS — Z3A35 35 weeks gestation of pregnancy: Secondary | ICD-10-CM | POA: Insufficient documentation

## 2018-01-21 DIAGNOSIS — O26892 Other specified pregnancy related conditions, second trimester: Secondary | ICD-10-CM

## 2018-01-21 LAB — POCT FERN TEST: POCT Fern Test: NEGATIVE

## 2018-01-21 NOTE — MAU Note (Signed)
Urine in lab 

## 2018-01-21 NOTE — MAU Note (Addendum)
Pt states she was in the shower she noticed a "glob of white stuff" come out and then afterwards she and her husband had sex and she noticed "more globs of white stuff." Wants to make sure water is not broken. Pt denies pain, vaginal bleeding. Reports some occasional braxton hicks. Reports good fetal movement.

## 2018-01-21 NOTE — MAU Provider Note (Signed)
History     CSN: [id number]  Arrival date and time: 01/21/18 2159   First Provider Initiated Contact with Patient 01/21/18 2306      Chief Complaint  Patient presents with  . Rupture of Membranes   HPI  Ms.  Haley Vang is a 27 y.o. year old [id number] female at 8013w0d weeks gestation who presents to MAU reporting "a glob of white stuff" came out of vagina while she was in the shower at about 2100 tonight, more white stuff came out while having SI with husband (after shower) at about 2115. She wants to make sure her water is not broken. She denies pain or VB. She does reports some Braxton-Hicks ctxs. She reports good (+) FM.    Past Medical History:  Diagnosis Date  . Anxiety   . Asthma    last used inhaler 1.5 months ago  . Depression   . Headache     Past Surgical History:  Procedure Laterality Date  . WISDOM TOOTH EXTRACTION      Family History  Problem Relation Age of Onset  . Hypertension Mother   . Thyroid disease Mother   . Depression Father   . Hypertension Father   . Stroke Paternal Grandmother     Social History   Tobacco Use  . Smoking status: Never Smoker  . Smokeless tobacco: Never Used  Substance Use Topics  . Alcohol use: No    Frequency: Never  . Drug use: No    Allergies:  Allergies  Allergen Reactions  . Ibuprofen Other (See Comments)    Causes bleeding. Has taken and had no problems     Medications Prior to Admission  Medication Sig Dispense Refill Last Dose  . acetaminophen (TYLENOL) 500 MG tablet Take 1,000 mg by mouth every 6 (six) hours as needed. For pain    Past Week at Unknown time  . ARIPiprazole (ABILIFY) 2 MG tablet Take 2 mg by mouth daily.     01/21/2018 at Unknown time  . cyclobenzaprine (FLEXERIL) 10 MG tablet Take 10 mg by mouth 3 (three) times daily as needed for muscle spasms.   Past Week at Unknown time  . FLUoxetine (PROZAC) 10 MG capsule Take 10 mg by mouth daily.     01/21/2018 at Unknown time  . Prenatal Vit-Fe  Fumarate-FA (MULTIVITAMIN-PRENATAL) 27-0.8 MG TABS tablet Take 1 tablet by mouth daily at 12 noon.   01/21/2018 at Unknown time  . albuterol (PROVENTIL HFA;VENTOLIN HFA) 108 (90 Base) MCG/ACT inhaler Inhale 2 puffs into the lungs every 6 (six) hours as needed for wheezing or shortness of breath.   More than a month at Unknown time  . cephALEXin (KEFLEX) 500 MG capsule Take 1 capsule (500 mg total) by mouth 4 (four) times daily. 40 capsule 0 More than a month at Unknown time  . ondansetron (ZOFRAN-ODT) 4 MG disintegrating tablet Take 1 tablet (4 mg total) by mouth every 8 (eight) hours as needed for nausea. 30 tablet 0     Review of Systems  Constitutional: Negative.   HENT: Negative.   Eyes: Negative.   Respiratory: Negative.   Cardiovascular: Negative.   Gastrointestinal: Positive for abdominal pain (some BH ctxs).  Endocrine: Negative.   Genitourinary: Positive for vaginal discharge ("glob of white stuff").  Musculoskeletal: Negative.   Skin: Negative.   Allergic/Immunologic: Negative.   Neurological: Negative.   Hematological: Negative.   Psychiatric/Behavioral: Negative.    Physical Exam   Blood pressure 120/77, pulse (!) 107, temperature 98.4  F (36.9 C), temperature source Oral, resp. rate 16, height 5\' 5"  (1.651 m), weight 132 kg, last menstrual period 05/31/2017, SpO2 100 %, unknown if currently breastfeeding.  Physical Exam  Nursing note and vitals reviewed. Constitutional: She is oriented to person, place, and time. She appears well-developed and well-nourished.  HENT:  Head: Normocephalic and atraumatic.  Eyes: Pupils are equal, round, and reactive to light.  Neck: Normal range of motion.  Cardiovascular: Normal rate.  Respiratory: Effort normal.  GI: Soft.  Genitourinary:  Genitourinary Comments: Uterus: gravid, S=D, SE: cervix is smooth, pink, no lesions, moderate amt of clear mucous & white vaginal d/c -- fern slide done,  VE: Dilation: 1 Effacement (%):  50 Station: Ballotable Presentation: Vertex Exam by: Carloyn Jaeger CNM   Musculoskeletal: Normal range of motion.  Neurological: She is alert and oriented to person, place, and time.  Skin: Skin is warm and dry.  Psychiatric: She has a normal mood and affect. Her behavior is normal. Judgment and thought content normal.    MAU Course  Procedures  MDM Fern Slide NST - FHR: 145 bpm / moderate variability / accels present / decels absent / TOCO: regular every 3-4 mins  Results for orders placed or performed during the hospital encounter of 01/21/18 (from the past 24 hour(s))  POCT fern test     Status: None   Collection Time: 01/21/18 11:32 PM  Result Value Ref Range   POCT Fern Test Negative = intact amniotic membranes      Assessment and Plan  No leakage of amniotic fluid into vagina - Plan: Discharge patient - Discharge home - Information provided on amniotic fluid test - Reassurance given that there is no PROM  - Advised to keep scheduled appt with P4W on 01/22/18  - Patient verbalized an understanding of the plan of care and agrees.     Raelyn Mora, MSN, CNM 01/22/2018, 11:08 PM

## 2018-01-22 DIAGNOSIS — O26893 Other specified pregnancy related conditions, third trimester: Secondary | ICD-10-CM | POA: Diagnosis present

## 2018-01-22 DIAGNOSIS — N949 Unspecified condition associated with female genital organs and menstrual cycle: Secondary | ICD-10-CM | POA: Diagnosis not present

## 2018-01-22 DIAGNOSIS — Z0371 Encounter for suspected problem with amniotic cavity and membrane ruled out: Secondary | ICD-10-CM | POA: Diagnosis not present

## 2018-01-22 DIAGNOSIS — Z3A35 35 weeks gestation of pregnancy: Secondary | ICD-10-CM | POA: Diagnosis not present

## 2018-01-22 NOTE — Discharge Instructions (Signed)
The test we performed to look for ruptured membranes was negative. You have mucous coming from cervix; which is not unusual. Please follow-up with your OB as needed.

## 2018-02-05 LAB — OB RESULTS CONSOLE GBS: GBS: POSITIVE

## 2018-02-14 ENCOUNTER — Telehealth (HOSPITAL_COMMUNITY): Payer: Self-pay | Admitting: *Deleted

## 2018-02-14 ENCOUNTER — Encounter (HOSPITAL_COMMUNITY): Payer: Self-pay | Admitting: *Deleted

## 2018-02-14 NOTE — Telephone Encounter (Signed)
Preadmission screen  

## 2018-02-15 ENCOUNTER — Telehealth (HOSPITAL_COMMUNITY): Payer: Self-pay | Admitting: Lactation Services

## 2018-02-15 NOTE — Telephone Encounter (Signed)
Called mom back but voicemail picked up. Voicemail addressed recipient as "Denisse" not Haley Vang. Left the lactation phone number in case she wants to call back for any further questions/concerns regarding her being induced on Tuesday January 7th; mom is still pregnant.

## 2018-02-18 ENCOUNTER — Telehealth (HOSPITAL_COMMUNITY): Payer: Self-pay | Admitting: Lactation Services

## 2018-02-18 ENCOUNTER — Inpatient Hospital Stay (HOSPITAL_COMMUNITY): Payer: BC Managed Care – PPO

## 2018-02-18 NOTE — H&P (Signed)
Haley Vang is a 28 y.o. female presenting for IOL. U/S in office 02/12/18 noted vtx, EFW 9/3 1oz (>97%).question of CHTN with BPs 120-140/80s, no treatment. OB History    Gravida  2   Para  1   Term  1   Preterm      AB      Living  1     SAB      TAB      Ectopic      Multiple  0   Live Births  1          Past Medical History:  Diagnosis Date  . Anxiety   . Asthma    last used inhaler 1.5 months ago  . Depression   . Headache    Past Surgical History:  Procedure Laterality Date  . WISDOM TOOTH EXTRACTION     Family History: family history includes Depression in her father; Hypertension in her father and mother; Stroke in her paternal grandmother; Thyroid disease in her mother. Social History:  reports that she has never smoked. She has never used smokeless tobacco. She reports that she does not drink alcohol or use drugs.     Maternal Diabetes: No Genetic Screening: Normal Maternal Ultrasounds/Referrals: Normal Fetal Ultrasounds or other Referrals:  None Maternal Substance Abuse:  No Significant Maternal Medications:  None Significant Maternal Lab Results:  None Other Comments:  None  ROS History   Last menstrual period 05/31/2017, unknown if currently breastfeeding. Exam Physical Exam   Lungs CTA Cor RRR Cx 3/50/-2 in office 02/12/18  Prenatal labs: ABO, Rh: O/Positive/-- (06/11 0000) Antibody: Negative (06/11 0000) Rubella: Immune (06/11 0000) RPR: Nonreactive (06/11 0000)  HBsAg: Negative (06/11 0000)  HIV: Non-reactive (06/11 0000)  GBS: Positive (12/24 0000)   Assessment/Plan: 28 yo G2P1 at term for IOL Atb per GBBS protocol   Roselle Locus II 02/18/2018, 2:33 PM

## 2018-02-18 NOTE — Telephone Encounter (Signed)
Returned mom's call with questions regarding breast feeding. Mom is being induced about 7 am tomorrow. Infant is term at 3439 weeks with no history of blood glucose issues with this pregnancy. Infant is thought to be > 9 pounds. Mom's milk was slow to come in with first pregnancy and she never made a full milk supply and then lost it about 3 weeks when infant was admitted to the hospital. She attempted to stimulate milk production for 6 months to no avail.   Mom has expressed some colostrum that she has frozen and will bring with her tomorrow. [facility] until needed.   Mom asked about when to begin pumping. Reviewed hand expressing the first 24 hours post BF and feed all EBM to infant. They reviewed beginning pumping around 24 hours or sooner if mom would like to stimulate milk production. Mom to bring her pump tubing from first pregnancy at her perference.   Will let Threasa AlphaKelly Black and Hart RobinsonsJonna Hunter know that mom is coming in for induction. Will follow up with mom tomorrow.

## 2018-02-19 ENCOUNTER — Inpatient Hospital Stay (HOSPITAL_COMMUNITY): Payer: BC Managed Care – PPO | Admitting: Anesthesiology

## 2018-02-19 ENCOUNTER — Encounter (HOSPITAL_COMMUNITY): Payer: Self-pay

## 2018-02-19 ENCOUNTER — Inpatient Hospital Stay (HOSPITAL_COMMUNITY)
Admission: RE | Admit: 2018-02-19 | Discharge: 2018-02-21 | DRG: 807 | Disposition: A | Discharge status: Home / Self Care | Payer: BC Managed Care – PPO | Attending: Obstetrics and Gynecology | Admitting: Obstetrics and Gynecology

## 2018-02-19 VITALS — BP 151/91 | HR 76 | Temp 98.2°F | Resp 18 | Ht 65.0 in | Wt 293.2 lb

## 2018-02-19 DIAGNOSIS — O9902 Anemia complicating childbirth: Secondary | ICD-10-CM | POA: Diagnosis present

## 2018-02-19 DIAGNOSIS — O99824 Streptococcus B carrier state complicating childbirth: Secondary | ICD-10-CM | POA: Diagnosis present

## 2018-02-19 DIAGNOSIS — Z0371 Encounter for suspected problem with amniotic cavity and membrane ruled out: Secondary | ICD-10-CM

## 2018-02-19 DIAGNOSIS — O9952 Diseases of the respiratory system complicating childbirth: Secondary | ICD-10-CM | POA: Diagnosis present

## 2018-02-19 DIAGNOSIS — Z3A39 39 weeks gestation of pregnancy: Secondary | ICD-10-CM | POA: Diagnosis not present

## 2018-02-19 DIAGNOSIS — O3663X0 Maternal care for excessive fetal growth, third trimester, not applicable or unspecified: Principal | ICD-10-CM | POA: Diagnosis present

## 2018-02-19 DIAGNOSIS — O26892 Other specified pregnancy related conditions, second trimester: Secondary | ICD-10-CM

## 2018-02-19 DIAGNOSIS — O99214 Obesity complicating childbirth: Secondary | ICD-10-CM | POA: Diagnosis present

## 2018-02-19 DIAGNOSIS — Z3490 Encounter for supervision of normal pregnancy, unspecified, unspecified trimester: Secondary | ICD-10-CM

## 2018-02-19 DIAGNOSIS — J45909 Unspecified asthma, uncomplicated: Secondary | ICD-10-CM | POA: Diagnosis present

## 2018-02-19 DIAGNOSIS — D649 Anemia, unspecified: Secondary | ICD-10-CM | POA: Diagnosis present

## 2018-02-19 DIAGNOSIS — O26893 Other specified pregnancy related conditions, third trimester: Secondary | ICD-10-CM | POA: Diagnosis present

## 2018-02-19 DIAGNOSIS — N949 Unspecified condition associated with female genital organs and menstrual cycle: Secondary | ICD-10-CM

## 2018-02-19 LAB — COMPREHENSIVE METABOLIC PANEL
ALK PHOS: 96 U/L (ref 38–126)
ALT: 19 U/L (ref 0–44)
AST: 24 U/L (ref 15–41)
Albumin: 2.9 g/dL — ABNORMAL LOW (ref 3.5–5.0)
Anion gap: 10 (ref 5–15)
BUN: 6 mg/dL (ref 6–20)
CO2: 18 mmol/L — ABNORMAL LOW (ref 22–32)
Calcium: 9.3 mg/dL (ref 8.9–10.3)
Chloride: 104 mmol/L (ref 98–111)
Creatinine, Ser: 0.65 mg/dL (ref 0.44–1.00)
GFR calc Af Amer: >60 mL/min (ref >60)
GFR calc non Af Amer: >60 mL/min (ref >60)
Glucose, Bld: 94 mg/dL (ref 70–99)
Potassium: 3.8 mmol/L (ref 3.5–5.1)
Sodium: 132 mmol/L — ABNORMAL LOW (ref 135–145)
Total Bilirubin: 0.2 mg/dL — ABNORMAL LOW (ref 0.3–1.2)
Total Protein: 6.7 g/dL (ref 6.5–8.1)

## 2018-02-19 LAB — CBC
HCT: 35.6 % — ABNORMAL LOW (ref 36.0–46.0)
Hemoglobin: 11.7 g/dL — ABNORMAL LOW (ref 12.0–15.0)
MCH: 28.1 pg (ref 26.0–34.0)
MCHC: 32.9 g/dL (ref 30.0–36.0)
MCV: 85.6 fL (ref 80.0–100.0)
Platelets: 220 10*3/uL (ref 150–400)
RBC: 4.16 MIL/uL (ref 3.87–5.11)
RDW: 15 % (ref 11.5–15.5)
WBC: 7.4 10*3/uL (ref 4.0–10.5)
nRBC: 0 % (ref 0.0–0.2)

## 2018-02-19 LAB — TYPE AND SCREEN
ABO/RH(D): O POS
Antibody Screen: NEGATIVE

## 2018-02-19 LAB — RPR: RPR Ser Ql: NONREACTIVE

## 2018-02-19 MED ORDER — ACETAMINOPHEN 325 MG PO TABS
650.0000 mg | ORAL_TABLET | ORAL | Status: DC | PRN
  Administered 2018-02-21 (×2): 650 mg via ORAL
  Filled 2018-02-19 (×2): qty 2

## 2018-02-19 MED ORDER — PHENYLEPHRINE 40 MCG/ML (10ML) SYRINGE FOR IV PUSH (FOR BLOOD PRESSURE SUPPORT)
80.0000 ug | PREFILLED_SYRINGE | INTRAVENOUS | Status: DC | PRN
  Filled 2018-02-19: qty 10

## 2018-02-19 MED ORDER — FLUTICASONE PROPIONATE 50 MCG/ACT NA SUSP
1.0000 | Freq: Every day | NASAL | Status: DC | PRN
  Filled 2018-02-19: qty 16

## 2018-02-19 MED ORDER — DIBUCAINE 1 % RE OINT
1.0000 "application " | TOPICAL_OINTMENT | RECTAL | Status: DC | PRN
  Filled 2018-02-19: qty 28

## 2018-02-19 MED ORDER — LACTATED RINGERS IV SOLN
INTRAVENOUS | Status: DC
  Administered 2018-02-19: 08:00:00 via INTRAVENOUS

## 2018-02-19 MED ORDER — OXYCODONE-ACETAMINOPHEN 5-325 MG PO TABS: 1.0000 | ORAL_TABLET | ORAL | Status: DC | PRN

## 2018-02-19 MED ORDER — SOD CITRATE-CITRIC ACID 500-334 MG/5ML PO SOLN: 30.0000 mL | ORAL | Status: DC | PRN

## 2018-02-19 MED ORDER — FLUOXETINE HCL 10 MG PO CAPS
10.0000 mg | ORAL_CAPSULE | Freq: Every day | ORAL | Status: DC
  Administered 2018-02-20 (×2): 10 mg via ORAL
  Filled 2018-02-19 (×2): qty 1

## 2018-02-19 MED ORDER — DIPHENHYDRAMINE HCL 50 MG/ML IJ SOLN: 12.5000 mg | INTRAMUSCULAR | Status: DC | PRN

## 2018-02-19 MED ORDER — LACTATED RINGERS IV SOLN: 500.0000 mL | Freq: Once | INTRAVENOUS | Status: DC

## 2018-02-19 MED ORDER — OXYCODONE-ACETAMINOPHEN 5-325 MG PO TABS: 2.0000 | ORAL_TABLET | ORAL | Status: DC | PRN

## 2018-02-19 MED ORDER — BENZOCAINE-MENTHOL 20-0.5 % EX AERO
1.0000 "application " | INHALATION_SPRAY | CUTANEOUS | Status: DC | PRN
  Filled 2018-02-19: qty 56

## 2018-02-19 MED ORDER — FLEET ENEMA 7-19 GM/118ML RE ENEM: 1.0000 | ENEMA | RECTAL | Status: DC | PRN

## 2018-02-19 MED ORDER — BUTORPHANOL TARTRATE 1 MG/ML IJ SOLN: 1.0000 mg | INTRAMUSCULAR | Status: DC | PRN

## 2018-02-19 MED ORDER — OXYTOCIN BOLUS FROM INFUSION: 500.0000 mL | Freq: Once | INTRAVENOUS | Status: DC

## 2018-02-19 MED ORDER — COCONUT OIL OIL
1.0000 "application " | TOPICAL_OIL | Status: DC | PRN
  Filled 2018-02-19: qty 120

## 2018-02-19 MED ORDER — TETANUS-DIPHTH-ACELL PERTUSSIS 5-2.5-18.5 LF-MCG/0.5 IM SUSP
0.5000 mL | Freq: Once | INTRAMUSCULAR | Status: DC
  Filled 2018-02-19: qty 0.5

## 2018-02-19 MED ORDER — OXYCODONE HCL 5 MG PO TABS: 5.0000 mg | ORAL_TABLET | ORAL | Status: DC | PRN

## 2018-02-19 MED ORDER — ONDANSETRON HCL 4 MG PO TABS
4.0000 mg | ORAL_TABLET | ORAL | Status: DC | PRN
  Filled 2018-02-19: qty 1

## 2018-02-19 MED ORDER — IBUPROFEN 600 MG PO TABS
600.0000 mg | ORAL_TABLET | Freq: Four times a day (QID) | ORAL | Status: DC
  Administered 2018-02-19 – 2018-02-21 (×6): 600 mg via ORAL
  Filled 2018-02-19 (×6): qty 1

## 2018-02-19 MED ORDER — OXYTOCIN 40 UNITS IN LACTATED RINGERS INFUSION - SIMPLE MED
1.0000 m[IU]/min | INTRAVENOUS | Status: DC
  Administered 2018-02-19: 2 m[IU]/min via INTRAVENOUS
  Filled 2018-02-19: qty 1000

## 2018-02-19 MED ORDER — PENICILLIN G 3 MILLION UNITS IVPB - SIMPLE MED
3.0000 10*6.[IU] | INTRAVENOUS | Status: DC
  Administered 2018-02-19 (×2): 3 10*6.[IU] via INTRAVENOUS

## 2018-02-19 MED ORDER — SODIUM CHLORIDE 0.9 % IV SOLN
5.0000 10*6.[IU] | Freq: Once | INTRAVENOUS | Status: AC
  Administered 2018-02-19: 5 10*6.[IU] via INTRAVENOUS
  Filled 2018-02-19: qty 5

## 2018-02-19 MED ORDER — ACETAMINOPHEN 325 MG PO TABS: 650.0000 mg | ORAL_TABLET | ORAL | Status: DC | PRN

## 2018-02-19 MED ORDER — SIMETHICONE 80 MG PO CHEW: 80.0000 mg | CHEWABLE_TABLET | ORAL | Status: DC | PRN

## 2018-02-19 MED ORDER — FENTANYL 2.5 MCG/ML BUPIVACAINE 1/10 % EPIDURAL INFUSION (WH - ANES)
14.0000 mL/h | INTRAMUSCULAR | Status: DC | PRN
  Administered 2018-02-19: 14 mL/h via EPIDURAL
  Filled 2018-02-19: qty 100

## 2018-02-19 MED ORDER — DIPHENHYDRAMINE HCL 25 MG PO CAPS: 25.0000 mg | ORAL_CAPSULE | Freq: Four times a day (QID) | ORAL | Status: DC | PRN

## 2018-02-19 MED ORDER — ONDANSETRON HCL 4 MG/2ML IJ SOLN: 4.0000 mg | INTRAMUSCULAR | Status: DC | PRN

## 2018-02-19 MED ORDER — TERBUTALINE SULFATE 1 MG/ML IJ SOLN: 0.2500 mg | Freq: Once | INTRAMUSCULAR | Status: DC | PRN

## 2018-02-19 MED ORDER — OXYTOCIN 40 UNITS IN LACTATED RINGERS INFUSION - SIMPLE MED: 2.5000 [IU]/h | INTRAVENOUS | Status: DC

## 2018-02-19 MED ORDER — WITCH HAZEL-GLYCERIN EX PADS: 1.0000 "application " | MEDICATED_PAD | CUTANEOUS | Status: DC | PRN

## 2018-02-19 MED ORDER — PHENYLEPHRINE 40 MCG/ML (10ML) SYRINGE FOR IV PUSH (FOR BLOOD PRESSURE SUPPORT): 80.0000 ug | PREFILLED_SYRINGE | INTRAVENOUS | Status: DC | PRN

## 2018-02-19 MED ORDER — ARIPIPRAZOLE 2 MG PO TABS
2.0000 mg | ORAL_TABLET | Freq: Every day | ORAL | Status: DC
  Administered 2018-02-20 (×2): 2 mg via ORAL
  Filled 2018-02-19: qty 1

## 2018-02-19 MED ORDER — ALBUTEROL SULFATE (2.5 MG/3ML) 0.083% IN NEBU: 3.0000 mL | INHALATION_SOLUTION | Freq: Four times a day (QID) | RESPIRATORY_TRACT | Status: DC | PRN

## 2018-02-19 MED ORDER — ZOLPIDEM TARTRATE 5 MG PO TABS: 5.0000 mg | ORAL_TABLET | Freq: Every evening | ORAL | Status: DC | PRN

## 2018-02-19 MED ORDER — SENNOSIDES-DOCUSATE SODIUM 8.6-50 MG PO TABS
2.0000 | ORAL_TABLET | ORAL | Status: DC
  Administered 2018-02-19 – 2018-02-20 (×2): 2 via ORAL
  Filled 2018-02-19 (×2): qty 2

## 2018-02-19 MED ORDER — LIDOCAINE HCL (PF) 1 % IJ SOLN
INTRAMUSCULAR | Status: DC | PRN
  Administered 2018-02-19: 5 mL via EPIDURAL
  Administered 2018-02-19: 3 mL via EPIDURAL
  Administered 2018-02-19: 2 mL via EPIDURAL

## 2018-02-19 MED ORDER — LIDOCAINE HCL (PF) 1 % IJ SOLN
30.0000 mL | INTRAMUSCULAR | Status: DC | PRN
  Filled 2018-02-19: qty 30

## 2018-02-19 MED ORDER — EPHEDRINE 5 MG/ML INJ: 10.0000 mg | INTRAVENOUS | Status: DC | PRN

## 2018-02-19 MED ORDER — PRENATAL MULTIVITAMIN CH
1.0000 | ORAL_TABLET | Freq: Every day | ORAL | Status: DC
  Administered 2018-02-20: 1 via ORAL
  Filled 2018-02-19: qty 1

## 2018-02-19 MED ORDER — CYCLOBENZAPRINE HCL 10 MG PO TABS
10.0000 mg | ORAL_TABLET | Freq: Three times a day (TID) | ORAL | Status: DC | PRN
  Filled 2018-02-19: qty 1

## 2018-02-19 MED ORDER — LACTATED RINGERS IV SOLN: 500.0000 mL | INTRAVENOUS | Status: DC | PRN

## 2018-02-19 MED ORDER — OXYTOCIN 40 UNITS IN NORMAL SALINE INFUSION - SIMPLE MED
1.0000 m[IU]/min | INTRAVENOUS | Status: DC
  Administered 2018-02-19: 6 m[IU]/min via INTRAVENOUS
  Administered 2018-02-19: 10 m[IU]/min via INTRAVENOUS
  Filled 2018-02-19: qty 1000

## 2018-02-19 MED ORDER — OXYTOCIN 40 UNITS IN NORMAL SALINE INFUSION - SIMPLE MED
2.5000 [IU]/h | INTRAVENOUS | Status: DC
  Administered 2018-02-19: 2.5 [IU]/h via INTRAVENOUS

## 2018-02-19 MED ORDER — OXYCODONE HCL 5 MG PO TABS: 10.0000 mg | ORAL_TABLET | ORAL | Status: DC | PRN

## 2018-02-19 MED ORDER — ONDANSETRON HCL 4 MG/2ML IJ SOLN
4.0000 mg | Freq: Four times a day (QID) | INTRAMUSCULAR | Status: DC | PRN
  Administered 2018-02-19: 4 mg via INTRAVENOUS
  Filled 2018-02-19: qty 2

## 2018-02-19 NOTE — Anesthesia Procedure Notes (Signed)
Epidural Patient location during procedure: OB Start time: 02/19/2018 11:53 AM End time: 02/19/2018 11:59 AM  Staffing Anesthesiologist: Cecile Hearing, MD Performed: anesthesiologist   Preanesthetic Checklist Completed: patient identified, pre-op evaluation, timeout performed, IV checked, risks and benefits discussed and monitors and equipment checked  Epidural Patient position: sitting Prep: DuraPrep Patient monitoring: blood pressure and continuous pulse ox Approach: midline Location: L3-L4 Injection technique: LOR air  Needle:  Needle type: Tuohy  Needle gauge: 17 G Needle length: 9 cm Needle insertion depth: 6 cm Catheter size: 19 Gauge Catheter at skin depth: 11 cm Test dose: negative and Other (1% Lidocaine)  Additional Notes Patient identified.  Risk benefits discussed including failed block, incomplete pain control, headache, nerve damage, paralysis, blood pressure changes, nausea, vomiting, reactions to medication both toxic or allergic, and postpartum back pain.  Patient expressed understanding and wished to proceed.  All questions were answered.  Sterile technique used throughout procedure and epidural site dressed with sterile barrier dressing. No paresthesia or other complications noted. The patient did not experience any signs of intravascular injection such as tinnitus or metallic taste in mouth nor signs of intrathecal spread such as rapid motor block. Please see nursing notes for vital signs. Reason for block:procedure for pain

## 2018-02-19 NOTE — Progress Notes (Signed)
No changes to H&P per patient history Lungs CTA Cor RRR Abdomen-uterus soft, NT Cx 3/ballotable per nurse check FHT cat one  A/P: D/W patient IOL, LGA         Begin pitocin         atb infusing per protocol for GBBS

## 2018-02-19 NOTE — Lactation Note (Signed)
Lactation Consultation Note  Patient Name: Haley Vang [id number] Date: 02/19/2018   Met with mom in [facility]. Mom brought 2 colostrum collection containers with about 1-2 cc Colostrum each from home. Colostrum was labeled with mom's label and placed in the NICU freezer until needed.   Spoke with mom's RN that infant is to feed normally once born and will be followed by Lactation. Mom does not wish for Lactation to see her unless she calls for assistance except for previously designated LC's (myself and [name], RN, [employer]).   Plan is for mom to feed infant on demand Hand express and feed all colostrum to infant that is obtained after each breast feeding session Begin pumping with DEBP no later than 24 hours of age to stimulate breast milk production.   Mom reports she has no questions/concerns at this time. Mom to call for [facility] assistance as needed.      Maternal Data    Feeding    LATCH Score                   Interventions    Lactation Tools Discussed/Used WIC Program: No   Consult Status      Donn Pierini 02/19/2018, 9:03 AM

## 2018-02-19 NOTE — Progress Notes (Signed)
FHT cat one UCs q2-5 min Cx 5/60/-2 IUPC placed

## 2018-02-19 NOTE — Progress Notes (Signed)
Delivery Note At 6:46 PM a viable female was delivered via  (Presentation:LOA ;  ).  APGAR: , ; weight pending .   Placenta status:intact , .  Cord: 3 vessels with the following complications:nuchal cord x 1 .  Cord pH: pending  Anesthesia:  epidural Episiotomy:  none Lacerations:  First degree ML lac-not bleeding, not repaired Suture Repair:  Est. Blood Loss (mL):  Per record  Mom to postpartum.  Baby to Couplet care / Skin to Skin.  Haley Vang 02/19/2018, 6:58 PM

## 2018-02-19 NOTE — Progress Notes (Signed)
FHT cat one UCs q3-4 min Cx 4/50/-2/vtx AROM clear Epidural in

## 2018-02-19 NOTE — Anesthesia Preprocedure Evaluation (Addendum)
Anesthesia Evaluation  Patient identified by MRN, date of birth, ID band Patient awake    Reviewed: Allergy & Precautions, NPO status , Patient's Chart, lab work & pertinent test results  Airway Mallampati: III  TM Distance: >3 FB Neck ROM: Full    Dental  (+) Teeth Intact, Dental Advisory Given   Pulmonary asthma ,    Pulmonary exam normal breath sounds clear to auscultation       Cardiovascular negative cardio ROS Normal cardiovascular exam Rhythm:Regular Rate:Normal     Neuro/Psych  Headaches, PSYCHIATRIC DISORDERS Anxiety Depression    GI/Hepatic negative GI ROS, Neg liver ROS,   Endo/Other  Morbid obesity  Renal/GU negative Renal ROS     Musculoskeletal negative musculoskeletal ROS (+)   Abdominal   Peds  Hematology  (+) Blood dyscrasia, anemia , Plt 220k   Anesthesia Other Findings Day of surgery medications reviewed with the patient.  Reproductive/Obstetrics (+) Pregnancy                             Anesthesia Physical Anesthesia Plan  ASA: III  Anesthesia Plan: Epidural   Post-op Pain Management:    Induction:   PONV Risk Score and Plan: 2 and Treatment may vary due to age or medical condition  Airway Management Planned: Natural Airway  Additional Equipment:   Intra-op Plan:   Post-operative Plan:   Informed Consent: I have reviewed the patients History and Physical, chart, labs and discussed the procedure including the risks, benefits and alternatives for the proposed anesthesia with the patient or authorized representative who has indicated his/her understanding and acceptance.   Dental advisory given  Plan Discussed with:   Anesthesia Plan Comments: (Patient identified. Risks/Benefits/Options discussed with patient including but not limited to bleeding, infection, nerve damage, paralysis, failed block, incomplete pain control, headache, blood pressure changes,  nausea, vomiting, reactions to medication both or allergic, itching and postpartum back pain. Confirmed with bedside nurse the patient's most recent platelet count. Confirmed with patient that they are not currently taking any anticoagulation, have any bleeding history or any family history of bleeding disorders. Patient expressed understanding and wished to proceed. All questions were answered. )        Anesthesia Quick Evaluation

## 2018-02-19 NOTE — Anesthesia Pain Management Evaluation Note (Signed)
  CRNA Pain Management Visit Note  Patient: Haley Vang, 28 y.o., female  "Hello I am a member of the anesthesia team at [facility]. We have an anesthesia team available at all times to provide care throughout the hospital, including epidural management and anesthesia for C-section. I don't know your plan for the delivery whether it a natural birth, water birth, IV sedation, nitrous supplementation, doula or epidural, but we want to meet your pain goals."   1.Was your pain managed to your expectations on prior hospitalizations?   Yes   2.What is your expectation for pain management during this hospitalization?     Epidural  3.How can we help you reach that goal? epidural  Record the patient's initial score and the patient's pain goal.   Pain: 0  Pain Goal: 5 [facility] wants you to be able to say your pain was always managed very well.  Kina Shiffman 02/19/2018

## 2018-02-20 LAB — CBC
HCT: 30.3 % — ABNORMAL LOW (ref 36.0–46.0)
Hemoglobin: 10 g/dL — ABNORMAL LOW (ref 12.0–15.0)
MCH: 28.2 pg (ref 26.0–34.0)
MCHC: 33 g/dL (ref 30.0–36.0)
MCV: 85.6 fL (ref 80.0–100.0)
NRBC: 0 % (ref 0.0–0.2)
Platelets: 185 10*3/uL (ref 150–400)
RBC: 3.54 MIL/uL — AB (ref 3.87–5.11)
RDW: 15.3 % (ref 11.5–15.5)
WBC: 9.1 10*3/uL (ref 4.0–10.5)

## 2018-02-20 NOTE — Progress Notes (Signed)
MOB was referred for history of depression/anxiety. * Referral screened out by Clinical Social Worker because none of the following criteria appear to apply: ~ History of anxiety/depression during this pregnancy, or of post-partum depression following prior delivery. ~ Diagnosis of anxiety and/or depression within last 3 years OR * MOB's symptoms currently being treated with medication and/or therapy. Please contact the Clinical Social Worker if needs arise, by MOB request, or if MOB scores greater than 9/yes to question 10 on Edinburgh Postpartum Depression Screen.  Haley Vang, MSW, LCSW Clinical Social Work [phone]  

## 2018-02-20 NOTE — Progress Notes (Signed)
Patient doing well. BP 120/78   Pulse 87   Temp 98.8 F (37.1 C) (Oral)   Resp 18   Ht 5\' 5"  (1.651 m)   Wt 133 kg   LMP 05/31/2017   SpO2 98%   Breastfeeding Unknown   BMI 48.79 kg/m  Results for orders placed or performed during the hospital encounter of 02/19/18 (from the past 24 hour(s))  CBC     Status: Abnormal   Collection Time: 02/20/18  5:29 AM  Result Value Ref Range   WBC 9.1 4.0 - 10.5 K/uL   RBC 3.54 (L) 3.87 - 5.11 MIL/uL   Hemoglobin 10.0 (L) 12.0 - 15.0 g/dL   HCT 50.0 (L) 93.8 - 18.2 %   MCV 85.6 80.0 - 100.0 fL   MCH 28.2 26.0 - 34.0 pg   MCHC 33.0 30.0 - 36.0 g/dL   RDW 99.3 71.6 - 96.7 %   Platelets 185 150 - 400 K/uL   nRBC 0.0 0.0 - 0.2 %   Abdomen soft and non tender  PPD # 1  Doing well circ today - discussed with patient

## 2018-02-20 NOTE — Lactation Note (Signed)
This note was copied from a baby's chart. Lactation Consultation Note  Patient Name: Haley Vang Date: 02/20/2018 Reason for consult: Follow-up assessment;Term   Follow up with mom of 21 hour old infant. Infant and mom taking a nap. Infant circumcised earlier and he has fed for 10 minutes since. Enc mom to place him STS every 2-3 hours and encourage feedings. Enc mom to use the EBM she has stored also for infant if he is not willing to latch.   Mom report she has no questions/concerns at this time. Will follow up with mom tomorrow prior to discharge.    Maternal Data Formula Feeding for Exclusion: No Has patient been taught Hand Expression?: Yes Does the patient have breastfeeding experience prior to this delivery?: Yes  Feeding Feeding Type: Breast Fed  LATCH Score                   Interventions    Lactation Tools Discussed/Used     Consult Status Consult Status: Follow-up Date: 02/21/18 Follow-up type: In-patient    [name] Carmelia Tiner 02/20/2018, 4:42 PM

## 2018-02-20 NOTE — Lactation Note (Addendum)
This note was copied from a baby's chart. Lactation Consultation Note  Patient Name: Haley Vang UEAVW'UToday's Date: 02/20/2018 Reason for consult: Initial assessment;Term   Initial consult postpartum for exp BF mom of 13 hour old infant. Infant with 8 BF for 10-15 min, 2 BF attempts, 1 spoon feeding per mom and dad during the night, 2 voids and 3 stools since birth. LATCH Scores 8-10. Infant weight 8 pounds 8 ounces with 1% weight loss since birth.   Follow up with mom after delivery. Mom reports BF is going well. Mom reports infant is latching well sometimes on one breast and sometimes on both breasts. Mom denies pain with feedings. Mom reports she is not having any difficulty latching infant to the breast and is using good pillow support with feedings.   Enc mom to feed infant STS with feeding cues, enc mom to offer both breasts with each feeding. Enc mom to keep infant awake with each feeding. Enc mom to hand express post feeding and spoon feed infant all milk that is expressed.   DEBP set up using mom's tubing she brought from home. Reviewed assembling, disassembling and cleaning of pump parts. Mom fit with # 24 flanges for pumping.  Enc mom to pump post BF for 15 minutes on Initiate setting and to follow with hand expression. Reviewed milk coming to volume and milk coming to volume. Mom is pleased to see colostrum. Mom is concerned with infant weight loss, discussed it is normal for newborns and that current weight loss is not concerning. Discussed with mom that infant weights would be watched while he is here and will let her know if there are any concerns.   Discussed with mom that infant needs to be closely watched for weights over time. Mom is planning to have infant follow up with Pediatrician for weight on Friday. She would like to follow up with Lactation on Monday or Tuesday (Jan 13-14). Mom reports her mom, GM and sister were not able to produce enough milk to feed their infants. Mom did  not make a full milk supply with her 1617 month old.   Mom has breast shells in the room, she is not sure she needs them and feels nipples are more everted than with first child. Areola is compressible.   BF Resources handout and LC Brochure given, mom aware of IP/OP Services, BF Support Groups and LC phone #. Mom to call out for feeding assistance as needed. This LC will follow up with mom tomorrow before discharge.   Mom takes Abilify qhs. Haley Vang in Medications and Mother's Milk classifies it as an L3-Limited Data, Probably compatible and Prozac which is Classified as an L2-Limited Data-Probably Compatible. Abilify is noted to have a potential for interfering with Prolactin release and therefore inhibiting Lactation. Mom is aware from previous conversations she has had. She had her Prolactin levels checked at some point and was found to be wnl per mom.    Report to Haley ShellBetsy McKimmon, RN. Parents and B. Vang are aware where EBM is stored in the NICU for infant.   Plan: Breast feed infant on demand Offer both breasts with each feeding Keep infant awake at the breast as needed Massage/compress breast with feeding Pump after breast feeding at least 4 x a day and follow with hand expression Feed all expressed milk to infant after next breast feeding EBM can also be used before latch if infant is sleepy  Call out for assistance as needed. Follow up  with Lactation tomorrow.   Mom reports she has no further questions/concerns at this time.      Maternal Data Formula Feeding for Exclusion: No Has patient been taught Hand Expression?: Yes Does the patient have breastfeeding experience prior to this delivery?: Yes  Feeding Feeding Type: Breast Fed  LATCH Score Latch: Grasps breast easily, tongue down, lips flanged, rhythmical sucking.  Audible Swallowing: A few with stimulation  Type of Nipple: Flat  Comfort (Breast/Nipple): Soft / non-tender  Hold (Positioning): Assistance  needed to correctly position infant at breast and maintain latch.  LATCH Score: 7  Interventions Interventions: Breast feeding basics reviewed;Skin to skin;Hand express;Expressed milk;Support pillows;Breast compression;Shells;DEBP  Lactation Tools Discussed/Used Tools: Shells;Pump WIC Program: No Pump Review: Setup, frequency, and cleaning;Milk Storage Initiated by:: Noralee Stain, RN, IBCLC Date initiated:: 02/20/18   Consult Status Consult Status: Follow-up Follow-up type: In-patient    Haley Vang 02/20/2018, 10:24 AM

## 2018-02-20 NOTE — Anesthesia Postprocedure Evaluation (Signed)
Anesthesia Post Note  Patient: Haley Vang  Procedure(s) Performed: AN AD HOC LABOR EPIDURAL     Patient location during evaluation: Mother Baby Anesthesia Type: Epidural Level of consciousness: awake Pain management: pain level controlled Vital Signs Assessment: post-procedure vital signs reviewed and stable Respiratory status: spontaneous breathing Cardiovascular status: stable Postop Assessment: epidural receding, patient able to bend at knees, no backache and no headache Anesthetic complications: no    Last Vitals:  Vitals:   02/20/18 0541 02/20/18 0830  BP: 120/78 121/68  Pulse: 87 82  Resp: 18 18  Temp: 37.1 C (!) 36.3 C  SpO2: 98%     Last Pain:  Vitals:   02/20/18 0830  TempSrc: Oral  PainSc:    Pain Goal:                 Edison Pace

## 2018-02-21 NOTE — Lactation Note (Signed)
This note was copied from a baby's chart. Lactation Consultation Note  Patient Name: Haley Jeani HawkingCarla Marcell RUEAV'WToday's Date: 02/21/2018 Reason for consult: Follow-up assessment;Term   Follow up with mom of 37 hour old infant. Infant with 15 BF for 10-60 minutes, 1 BF attempt, EBM x 1 of 2 cc, 4 voids and 2 stools in the last 24 hours. Infant weight 8 pounds 1.6 ounces with 6% weight loss since birth. Latch scores 7-9.   Mom reports infant has been cluster feeding. Mom reports her left breast is feeling fuller. Mom is hand expressing and giving infant some colostrum from a syringe (mom reports volumes are a few gtts at a time) mom is pumping some and not getting milk in the pump although able to hand express easily.   Reviewed I/O, Signs of dehydration in the infant, signs infant is getting enough at the breast, breast milk expression and storage and engorgement prevention and treatment. Mom reports she was tempted to give formula last night but refrained. Reviewed that infant weight will be checked tomorrow and he has good output based on day of age. Mom is concerned with weight loss, Dr. Nash DimmerQuinlan and I reassured her it is wnl at this time.   Enc mom to continue feeding STS with feeding cues. Enc mom to offer both breasts with each feeding. Worked with mom on positioning and supporting the breast with feeding. Enc mom to continue pumping post BF and hand expressing and feeding infant all expressed milk. Mom fed infant her stored breast milk last night.   BF Resources handout and LC Brochure given, mom aware of OP services, BF Support Groups and LC phone #. Mom to follow up next week with OP Lactation. Infant to follow up with pediatrician tomorrow.    Maternal Data Formula Feeding for Exclusion: No Has patient been taught Hand Expression?: No Does the patient have breastfeeding experience prior to this delivery?: Yes  Feeding Feeding Type: Breast Fed  LATCH Score Latch: Grasps breast easily, tongue  down, lips flanged, rhythmical sucking.  Audible Swallowing: A few with stimulation  Type of Nipple: Everted at rest and after stimulation  Comfort (Breast/Nipple): Soft / non-tender  Hold (Positioning): Assistance needed to correctly position infant at breast and maintain latch.  LATCH Score: 8  Interventions Interventions: Breast feeding basics reviewed;Assisted with latch;Skin to skin;Breast massage;Hand express;Expressed milk;Support pillows;Adjust position;Breast compression;DEBP  Lactation Tools Discussed/Used Tools: Pump WIC Program: No Pump Review: Setup, frequency, and cleaning;Milk Storage Initiated by:: Reviewed and encouraged about every 3 hours post feeding to supplement infant    Consult Status Consult Status: Follow-up Follow-up type: Out-patient    Haley Vang 02/21/2018, 8:11 AM

## 2018-02-21 NOTE — Discharge Summary (Signed)
Obstetric Discharge Summary Reason for Admission: induction of labor Prenatal Procedures: none Intrapartum Procedures: spontaneous vaginal delivery Postpartum Procedures: none Complications-Operative and Postpartum: 1 degree perineal laceration Hemoglobin  Date Value Ref Range Status  02/20/2018 10.0 (L) 12.0 - 15.0 g/dL Final   HCT  Date Value Ref Range Status  02/20/2018 30.3 (L) 36.0 - 46.0 % Final    Physical Exam:  General: alert, cooperative, appears stated age and no distress Lochia: appropriate Uterine Fundus: firm Incision: healing well DVT Evaluation: No evidence of DVT seen on physical exam.  Discharge Diagnoses: Term Pregnancy-delivered  Discharge Information: Date: 02/21/2018 Activity: pelvic rest Diet: routine Medications: None Condition: stable Instructions: refer to practice specific booklet Discharge to: home   Newborn Data: Live born female  Birth Weight: 8 lb 9.6 oz (3901 g) APGAR: 8, 9  Newborn Delivery   Birth date/time:  02/19/2019 18:46:00 Delivery type:  Vaginal, Spontaneous     Home with mother.  Turner Danielsavid C Juwaun Inskeep 02/21/2018, 8:39 AM

## 2018-02-21 NOTE — Progress Notes (Addendum)
MOB requests to see lactation consultant.  LC informed.  Baby has been cluster feeding all night.  Baby latches well once on breast and was able to hear swallows.  RN assisted couple times throughout the night.  MOB BP 151/91 this morning, MOB states her BP increased d/t frustration with breastfeeding.  RN offer to help, MOB declined at this time. Reassure MOB LC informed and will see her after seeing one more couplet.

## 2018-02-25 ENCOUNTER — Ambulatory Visit: Payer: Self-pay

## 2018-02-25 NOTE — Lactation Note (Addendum)
This note was copied from a baby's chart. 02/25/2018  Name: Haley Vang MRN: [id number] Date of Birth: 02/19/2019 Gestational Age: Gestational Age: 3151w1d Birth Weight: 137.6 oz Weight today:    8 pounds 4.8 ounces (3674 grams) with clean size 1 diaper  Haley Vang is a 226 day old term infant who presents today with mom for feeding assessment.   Infant has gained 90 grams in the last 4 days with an average daily weight gain of 23 grams a day. Infant lost down to 7 pounds 8 ounces after discharge per mom. Mom reports infant did not cluster feed this night before his appt.   Mom reports her milk supply came in yesterday. She has been able to wean down the formula supplementation. Mom is pumping with each feeding and feeding all pumped milk back to infant.   Infant is self awakening and feeding on both breasts with each feeding. Mom is supplementing infant with her pumped milk and some formula.   Infant has not stooled since Thursday morning, infant has moderate soft green stool in the office today.   Infant latched to both breasts and appeared to be feeding well at the breast although he is not transferring well. Infant is eating for a short period but is actively suckling at the breast. Mom does well with latching and supporting infant at the breast. Mom massages/compresses breast with the feeding.   Infant with thick labial frenulum that inserts at the bottom of the gum ridge. Upper lip tight with flanging. Upper lip flanges well on the breast. Infant with limited tongue elevation.   Infant to follow up with Dr. Nash DimmerQuinlan on Feb 26th. Mom aware of BF Support Groups. Infant to follow up with Lactation in 1 week.       General Information: Mother's reason for visit: Feeding assessment, weight check Consult: Initial Lactation consultant: Noralee StainSharon Jahayra Mazo RN,IBCLC Breastfeeding experience: breast feeding with each feeding and supplementing with EBM and formula  Maternal medical conditions:  History post partum depression, Other(+ breast growth with pregnancy) Maternal medications: Pre-natal vitamin, Other(Prozac, Abilify, Fenugreek 2 Capsule BID)  Breastfeeding History: Frequency of breast feeding: every 2-3 hours, every 3.5-4 hours Duration of feeding: 20 minutes  Supplementation: Supplement method: bottle([facility], Dr. Theora GianottiBrown's ) Brand: Similac Formula volume: 1-2 ounces Formula frequency: 2-3 x a day   Breast milk volume: 1-2 ounces Breast milk frequency: every 3 hours   Pump type: Medela pump in style(Medela PIS and Spectra 2) Pump frequency: every 2-3 hours Pump volume: < 1 ounce per pumping  Infant Output Assessment: Voids per 24 hours: 6 Urine color: Clear yellow Stools per 24 hours: last stool Thursday Morning Stool color: Yellow  Breast Assessment: Breast: Soft, Compressible Nipple: Erect Pain level: 0 Pain interventions: Bra  Feeding Assessment: Infant oral assessment: Variance Infant oral assessment comment: Infant with thick labial frenulum that inserts at the bottom of the gum ridge. Upper lip blanches with flanging. Lip flanges well on the breast. Infant with sucking blister to the upper center lip.  Infant with snapback when sucking on gloved finger . Infant with good tongue lateralization. Infant with chomping and disorganized suck on the finger and on the bottle.  Infant is drooling on the bottle.  Showed mom tongue and lip restrictions and discussed how tongue and lip restrictions can effect milk supply and milk transfer. Mom given information on Tongue and lip restrictions and local providers.  Positioning: Football Latch: 1 - Repeated attempts needed to sustain latch, nipple held in  mouth throughout feeding, stimulation needed to elicit sucking reflex. Audible swallowing: 1 - A few with stimulation Type of nipple: 2 - Everted at rest and after stimulation Comfort: 2 - Soft/non-tender Hold: 2 - No assistance needed to correctly position infant at  breast LATCH score: 8 Latch assessment: Deep Lips flanged: Yes Suck assessment: Displays both   Pre-feed weight: 3764 grams/3774 grams Post feed weight: 3766 grams/3774 grams Amount transferred: 2 ml/0 ml Amount supplemented: 0  Additional Feeding Assessment: Infant oral assessment: Variance Infant oral assessment comment: see above Positioning: Football(right breast, 10 minutes) Latch: 2 - Grasps breast easily, tongue down, lips flanged, rhythmical sucking. Audible swallowing: 1 - A few with stimulation Type of nipple: 2 - Everted at rest and after stimulation Comfort: 2 - Soft/non-tender Hold: 2 - No assistance needed to correctly position infant at breast LATCH score: 9 Latch assessment: Deep Lips flanged: Yes Suck assessment: Displays both   Pre-feed weight: 3766 grams Post feed weight: 3774 grams Amount transferred: 8 ml Amount supplemented: 30 ml EBM, 30 ml Formula  Totals: Total amount transferred: 10 ml Total supplement given: 30 ml EBM, 30 ml Formula Total amount pumped post feed: 15 ml   Plan:   1. Offer infant the breast with feeding cues, feed on both breasts with each feeding 2. Keep infant awake with feeding as needed 3. Massage/compress breast with feedings to maximize milk flow 4. Empty the first breast before offering the second breast 5. Offer infant a bottle of pumped breast milk after breast feeding, especially if he is cueing to feed.  6. Feed infant using the paced bottle method (video on [email or web address].com) 7. Infant needs about 69-93 ounces (2.5-3 ounces) for 8 feedings a day or 555-740 ml (19-25 ounces) in 24 hours. Infant may eat more or less depending on how often he feeds. Feed him until he is satisfied 8. Continue pumping 7-8 x a day post breast feeding to protect milk supply. Pump using your double electric breast pump for 15-20 minutes to empty the breast 9. Increase Fenugreek to 3 capsules 3 times a day 10.Marland Kitchen. Keep up the good work 11. Thank you  for allowing me to assist you today 12. Please call with any questions/concerns as needed [phone] 13. Follow up with Lactation in 1 week  Ed BlalockSharon S Kealy Lewter RN, IBCLC                                                     Ed BlalockSharon S [place] 02/25/2018, 3:28 PM

## 2018-03-04 ENCOUNTER — Ambulatory Visit: Payer: Self-pay

## 2018-03-04 NOTE — Lactation Note (Signed)
This note was copied from a baby's chart. 03/04/2018  Name: Haley Vang MRN: [id number] Date of Birth: 02/19/2019 Gestational Age: Gestational Age: 4958w1d Birth Weight: 137.6 oz Weight today:    8 pounds 13.2 ounces (4002 grams) with clean size 1 diaper   8113 day old infant presents today with mom for follow up feeding assessment. Infant is BF on the left breast as right breast is very painful. Mom is pumping with each feeding and supplementing infant.   Infant has gained 328 grams in the last 7 days with an average daily weight gain of 47 grams a day. Mom is pleased with infant weight gain, infant is over birthweight.   Mom is able to pump most of infant's supplement with 1 bottle of formula a day. Reviewed power pumping and mom and discussed Moringa to add to Fenugreek.   Infant latched and fed on the left breast and transferred 12 ml. Mom denied pain with feeding, nipple asymmetrical post latch. Assisted mom in placing # 24 NS that she had with her to the right breast, mom reports no pain with NS in place.. Added the SNS to the feeding and infant transferred well from the SNS with very little from mom. Mom pumped 15 cc post feeding.   Infant to be followed by Dr. Orland MustardMcMurtry on 1/28 to evaluate tongue and lip for restrictions.   Mom is concerned with milk supply and sore nipple. She is noted to have a scab to the face of the nipple on the upper edge and a blister to the center of the nipple. Mom using comfort gels and coconut oil to nipples.  Discussed calling OB for APNO to help get nipple healed.   Infant is inconsistent with transferring milk at the breast. He is supplemented with the bottle with each feeding. Mom's tissue is soft and compressible. Infant did transfer better today from the breast that last week.   Infant to follow up with Lactation next week. Mom to call with questions/concerns as needed.      General Information: Mother's reason for visit: Follow up feeding  assessment Consult: Follow-up Lactation consultant: Noralee StainSharon Rushton Early RN,IBCLC Breastfeeding experience: Breast feeding with each feeding, resting the right nipple. pumping and bottle feeding Maternal medical conditions: History post partum depression Maternal medications: Pre-natal vitamin, Other(Prozac, Abilify, Fenugreek 3 tablets TID)  Breastfeeding History: Frequency of breast feeding: every 3-4 hours with a 5 hours stretch some nights Duration of feeding: 10-15 minutes  Supplementation: Supplement method: bottle(Dr. Brown's Preemie Nipple, less drooling) Brand: Similac Formula volume: 2-3 ounces Formula frequency: 1 x a day Total formula volume per day: 2-3 ounces Breast milk volume: 2-3 ounces Breast milk frequency: every 3-4 hours   Pump type: Medela pump in style(Medela PIS and Spectra 2) Pump frequency: 7-8 x a day Pump volume: 1-3 ounces  Infant Output Assessment: Voids per 24 hours: 7-8 Urine color: Clear yellow Stools per 24 hours: 2-3 x a day Stool color: Yellow  Breast Assessment: Breast: Soft, Compressible Nipple: Erect Pain level: 9(right nipple) Pain interventions: Bra, Coconut oil, Comfort gels  Feeding Assessment: Infant oral assessment: Variance Infant oral assessment comment: Infant with thick labial frenulum that inserts at the bottom of the gum ridge. Upper lip blanches with flanging. Lip flanges well on the breast. Infant with better tongue extension on gloved finger today. infant chomping on the finger today, not as disorganized on the breast. Infant with decreased tongue elevation. infant to see Dr. Orland MustardMcMurtry on Jan. 28th.  Positioning: Football(left breast, 10 minutes) Latch: 2 - Grasps breast easily, tongue down, lips flanged, rhythmical sucking. Audible swallowing: 1 - A few with stimulation Type of nipple: 2 - Everted at rest and after stimulation Comfort: 2 - Soft/non-tender Hold: 2 - No assistance needed to correctly position infant at  breast LATCH score: 9 Latch assessment: Deep Lips flanged: Yes Suck assessment: Displays both   Pre-feed weight: 4002 grams Post feed weight: 4014 grams Amount transferred: 12 ml Amount supplemented: 0  Additional Feeding Assessment: Infant oral assessment: Variance(right breast, 15 minutes) Infant oral assessment comment: see above Positioning: Football(right breast, 15 minutes) Latch: 1 - Repeated attempts neede to sustain latch, nipple held in mouth throughout feeding, stimulation needed to elicit sucking reflex. Audible swallowing: 2 - Spontaneous and intermittent Type of nipple: 2 - Everted at rest and after stimulation Comfort: 1 - Filling, red/small blisters or bruises, mild/mod discomfort Hold: 2 - No assistance needed to correctly position infant at breast LATCH score: 8 Latch assessment: Deep Lips flanged: No(upper lip needs flanging) Suck assessment: Displays both Tools: Nipple shield 24 mm, Supplemental nursing system (SNS) Pre-feed weight: 3984 grams Post feed weight: 4020 grams Amount transferred: 6 ml Amount supplemented: 30 ebm via SNS, 30 ml via bottle  Totals: Total amount transferred: 18 ml Total supplement given: 60 ml EBM Total amount pumped post feed: 15 ml  1. Offer infant the breast with feeding cues, feed on both breasts with each feeding 2. Keep infant awake with feeding as needed 3. Massage/compress breast with feedings to maximize milk flow 4. Empty the first breast before offering the second breast 5. Can use the SNS with feedings as you want. Use the Nipple shield as needed for pain with feeding 6. Offer infant a bottle of pumped breast milk after breast feeding, especially if he is cueing to feed.  7. Feed infant using the paced bottle method (video on [email or web address].com) 8. Infant needs about 75-100 ounces (2.5-3.5 ounces) for 8 feedings a day or 600-800 ml (20-27 ounces) in 24 hours. Infant may eat more or less depending on how often he feeds. Feed  him until he is satisfied 9. Continue pumping 7-8 x a day post breast feeding to protect milk supply. Pump using your double electric breast pump for 15-20 minutes to empty the breast 10. Power pumping once a day may be helpful for your supply. Pump for 20 minutes, rest for 10 minutes, pump for 10 minutes, rest for 10 minutes, pump for 10 minutes 11. Moringa may be helpful for helping with milk supply. Can be found at health food store or on [employer]. Typical dosage is 400 mg tablets, 3 tablets 3 times a day.  12. Call OB to ask for All Purpose Nipple Ointment (Pick up at [facility] in [facility])  13.  Increase Fenugreek to 3 capsules 3 times a day 14. Keep up the good work 15. Thank you for allowing me to assist you today 16. Please call with any questions/concerns as needed [phone] 17. Follow up with Lactation next Wednesday, Thursday, or Friday  [facility] RN, IBCLC                                                     [name] [place] 03/04/2018, 2:06 PM

## 2018-03-07 ENCOUNTER — Ambulatory Visit: Payer: Self-pay

## 2018-03-07 NOTE — Lactation Note (Signed)
This note was copied from a baby's chart.   03/07/2018  Name: Haley Vang MRN: [id number] Date of Birth: 02/19/2019 Gestational Age: Gestational Age: 7468w1d Birth Weight: 137.6 oz Weight today:      Infant presents today for follow up after tongue and lip release yesterday by Dr. Orland MustardMcMurtry.   Infant has gained 96 grams in the last 3 days with an average daily weight gain of 32 grams a day.   Infant is BF about 10 minutes on each side. He is supplementing with the bottle of breast milk and formula with every feeding. Infant is being supplemented at the breast with the SNS and with a bottle afterwards.   Mom is pumping about every 4 hours. Infant got about 6 ounces of formula yesterday. Mom gets about 2 ounces with each pumping.   Infant nursed on both breasts and transferred very little. He finished his feeding with  a Dr. Theora GianottiBrown's Preemie Nipple. Infant with some chomping on the bottle and some drooling.   Mom reports [facility] was able [facility] and her left nipple has healed.   Infant to follow up with Dr. Nash DimmerQuinlan the end of February. Infant to follow up with Lactation in 2 weeks at mom's request. Infant to follow up with Dr. Orland MustardMcMurtry on 03/21/2018.  Mom to call with questions/concerns as needed.    General Information: Mother's reason for visit: (P) Follow up feeding assessment, Post Tongue/lip releases on 1/22 by Dr. Orland MustardMcMurtry Consult: (P) Follow-up Lactation consultant: Demetrius Charity(P) Jasmine DecemberSharon  RN,IBCLC Breastfeeding experience: (P) BF with each feeding, supplementing with EBM and formula via SNS and bottle with each feeding. mom pumping Maternal medical conditions: (P) History post partum depression Maternal medications: (P) Pre-natal vitamin, Other(Prozaz, Abilify, Fenugreek 3 tablets 3 x a day)  Breastfeeding History: Frequency of breast feeding: (P) every 3-4 hours Duration of feeding: (P) 10 minutes each side  Supplementation: Supplement method: (P) bottle, supplemental nursing  system (SNS)(Dr. Brown's Preemie Nipple) Brand: (P) Similac Formula volume: (P) 3 ounces Formula frequency: (P) 2 x a day Total formula volume per day: (P) 6 ounces yesterday Breast milk volume: (P) 3-3.5 ounces Breast milk frequency: (P) every 3-4 hours   Pump type: (P) Medela pump in style(Medela PIS/Spectra 2) Pump frequency: (P) 6-7 x a day Pump volume: (P) 2 ounces  Infant Output Assessment: Voids per 24 hours: (P) 7-8 Urine color: (P) Clear yellow Stools per 24 hours: (P) 2-3 x a day Stool color: (P) Yellow  Breast Assessment: Breast: (P) Soft, Compressible Nipple: (P) Erect Pain level: (P) 0 Pain interventions: (P) Bra, Coconut oil, All purpose nipple cream  Feeding Assessment: Infant oral assessment: (P) Variance Infant oral assessment comment: (P) Infant with granulation tissue to upper lip, upper lip flanges better. Tongue with diamond shape granulation tissue under tongue. Dad is performing stretches per Dr. Orland MustardMcMurtry. Suck training exercises taught to mom and handout given.  Positioning: (P) Football(right breast) Latch: (P) 2 - Grasps breast easily, tongue down, lips flanged, rhythmical sucking. Audible swallowing: (P) 1 - A few with stimulation Type of nipple: (P) 2 - Everted at rest and after stimulation Comfort: (P) 2 - Soft/non-tender Hold: (P) 2 - No assistance needed to correctly position infant at breast LATCH score: (P) 9 Latch assessment: (P) Deep Lips flanged: (P) Yes Suck assessment: (P) Displays both   Pre-feed weight: (P) 4098 grams Post feed weight: (P) 4100 grams Amount transferred: (P) 2 ml Amount supplemented: (P) 0  Additional Feeding Assessment: Infant oral  assessment: (P) Variance Infant oral assessment comment: (P) see above Positioning: (P) Football(left breast, 10 minutes) Latch: (P) 2 - Grasps breast easily, tongue down, lips flanged, rhythmical sucking. Audible swallowing: (P) 1 - A few with stimulation Type of nipple: (P) 2 -  Everted at rest and after stimulation Comfort: (P) 2 - Soft/non-tender Hold: (P) 2 - No assistance needed to correctly position infant at breast LATCH score: (P) 9 Latch assessment: (P) Deep Lips flanged: (P) Yes Suck assessment: (P) Nutritive   Pre-feed weight: (P) 4100 grams Post feed weight: (P) 4106 grams Amount transferred: (P) 6 ml    Totals: Total amount transferred: (P) 8 ml  Total amount Supplemented 3.5 ounces formula via bottle Total amount pumped with Symphony pump 15 ml.      Plan:  1. Offer infant the breast with feeding cues, feed on both breasts with each feeding 2. Keep infant awake with feeding as needed 3. Massage/compress breast with feedings to maximize milk flow 4. Empty the first breast before offering the second breast 5. Can use the SNS with feedings as you want. Use the Nipple shield as needed for pain with feeding 6. Offer infant a bottle of pumped breast milk after breast feeding, especially if he is cueing to feed.  7. Feed infant using the paced bottle method (video on kellymom.com) 8. Infant needs about 75-100 ounces (2.5-3.5 ounces) for 8 feedings a day or 600-800 ml (20-27 ounces) in 24 hours. Infant may eat more or less depending on how often he feeds. Feed him until he is satisfied 9. Continue pumping 7-8 x a day post breast feeding to protect milk supply. Pump using your double electric breast pump for 15-20 minutes to empty the breast 10. Power pumping once a day may be helpful for your supply. Pump for 20 minutes, rest for 10 minutes, pump for 10 minutes, rest for 10 minutes, pump for 10 minutes 11. Moringa may be helpful for helping with milk supply. Can be found at health food store or on [employer]. Typical dosage is 400 mg tablets, 3 tablets 3 times a day.  12.  Increase Fenugreek to 3 capsules 3 times a day 13. Keep up the good work 14. Thank you for allowing me to assist you today 15. Please call with any questions/concerns as needed [phone]  [phone] 16. Follow up with Lactation in 2 weeks  Ed Blalock RN, IBCLC                                                   Ed Blalock 03/07/2018, 8:31 AM

## 2018-03-20 ENCOUNTER — Ambulatory Visit: Payer: Self-pay

## 2018-03-20 NOTE — Lactation Note (Signed)
This note was copied from a baby's chart. 03/20/2018  Name: Haley Vang MRN: [id number] Date of Birth: 02/19/2019 Gestational Age: Gestational Age: [redacted]w[redacted]d Birth Weight: 137.6 oz Weight today:    10 pounds 1.2 ounces (4570 grams) with clean size 1 diaper    24 week old infant presents today for follow up feeding assessment. Infant is 2 weeks post Tongue and lip release.   Mom reports her supply is dropping. She is BF with each feeding and is pumping 6-8 x a day. Mom is power pumping once a day. Mom has stopped Fenugreek and is taking Moringa without success with increasing supply. Mom plans to call her PCP to discuss possibility of Reglan. Mom is aware there is a high incidence of Depression noted and will need to be monitored closely if she does take it    Mom does not feel infant is transferring at the breast. She is BF with each feeding and pumping afterwards.   Infant latched to the breast with and without the NS. Infant latches deeply and then pushes off the breast. Infant did not transfer well with or without the NS nor with the 5 french feeding tube under the NS (milk has to be pushed). Nipple is compressed post feeding. Infant noted to be chomping on the breast and the bottle with some intermittent suckling bursts noted. Infant fed well on the Avent Natural Flow nipple. He took a long time to eat on the Tommie Tippee Extra Slow Flow nipple, he choked on the Tommie Tippee Slow Flow Nipple. Showed mom how to use chin support with feeding. Dancer hand position handout given for mom to try at the breast.   Mom is feeling discouraged with feeding and milk supply. Discussed I have seen infants take up to 4 weeks post tongue and lip release to see an improvement. Mom is doing all that she can to increase her milk supply. We discussed stress can be contributing. Her mom, GM, and sister were not successful in producing a full milk supply. Abilify has also been known to decrease some mom's milk  supply. Discussed with mom that she will know if there comes a time to stop pumping or BF. Praised mom for all her efforts. Mom reports she is pleased infant is getting some breast milk at least. Reminded mom, Infant is healthy and growing well.  Enc mom to call with any questions or concerns.    Infant to follow up with Lactation in 2 weeks at mom's request. Enc mom to continue Sucking exercises until infant improving transfer at the breast. Infant to follow up with Dr. Orland Mustard 2/6.      General Information: Mother's reason for visit: Follow up feeding assessment, post tongue/lip releases Consult: Follow-up Lactation consultant: Noralee Stain RN,IBCLC Breastfeeding experience: not transferring well at the breast, milk supply is dropping Maternal medical conditions: History post partum depression Maternal medications: Pre-natal vitamin(Prozac, Abilify, Moringa)  Breastfeeding History: Frequency of breast feeding: every 3-4 hours, slept all night last night Duration of feeding: 10-15 minutes  Supplementation: Supplement method: bottle(Tommie Tippee Extra Slow Flow and Avent Natural Flow) Brand: Similac Formula volume: 3-4 ounces Formula frequency: 6-7 x a day   Breast milk volume: 1 ounce Breast milk frequency: 6-7 x a day   Pump type: Medela pump in style Pump frequency: 6-8 x a day, power pumping once a day Pump volume: 1/2-1 ounces  Infant Output Assessment: Voids per 24 hours: 7-8 Urine color: Clear yellow Stools per 24 hours:  2-3  Stool color: Yellow  Breast Assessment: Breast: Soft, Compressible Nipple: Erect Pain level: 0 Pain interventions: Bra  Feeding Assessment: Infant oral assessment: Variance Infant oral assessment comment: see note Positioning: Football(right breast ) Latch: 1 - Repeated attempts needed to sustain latch, nipple held in mouth throughout feeding, stimulation needed to elicit sucking reflex. Audible swallowing: 1 - A few with  stimulation Type of nipple: 2 - Everted at rest and after stimulation Comfort: 2 - Soft/non-tender Hold: 2 - No assistance needed to correctly position infant at breast LATCH score: 8 Latch assessment: Shallow Lips flanged: Yes Suck assessment: Displays both Tools: Nipple shield 24 mm, Syringe with 5 Fr feeding tube Pre-feed weight: 4570 grams Post feed weight: 4592 grams Amount transferred: 0 Amount supplemented: 22 ml EBM  Additional Feeding Assessment: Infant oral assessment: Variance Infant oral assessment comment: see above Positioning: Football(left breast) Latch: 1 - Repeated attempts neede to sustain latch, nipple held in mouth throughout feeding, stimulation needed to elicit sucking reflex. Audible swallowing: 1 - A few with stimulation Type of nipple: 2 - Everted at rest and after stimulation Comfort: 2 - Soft/non-tender Hold: 2 - No assistance needed to correctly position infant at breast LATCH score: 8 Latch assessment: Shallow Lips flanged: Yes Suck assessment: Displays both   Pre-feed weight: 4592 grams Post feed weight: 4598 grams Amount transferred: 6 ml Amount supplemented: 10 ml EBM plus 2 ounces formula via bottle  Totals: Total amount transferred: 6 ml Total supplement given: 32 ml EBM, 60 ml Formula via bottle Total amount pumped post feed: 10 ml   Plan:   1. Offer infant the breast with feeding cues, feed on both breasts with each feeding 2. Keep infant awake with feeding as needed 3. Massage/compress breast with feedings to maximize milk flow 4. Empty the first breast before offering the second breast 5. Can use the SNS with feedings as you want. Use the Nipple shield as needed for pain with feeding 6. Offer infant a bottle of pumped breast milk after breast feeding, especially if he is cueing to feed.  7. Feed infant using the paced bottle method (video on [email or web address].com) 8. Infant needs about 84-113 ounces (3-3.75 ounces) for 8 feedings a day or  675-900 ml (23-30 ounces) in 24 hours. Infant may eat more or less depending on how often he feeds. Feed him until he is satisfied 9. Continue pumping 7-8 x a day post breast feeding to protect milk supply. Pump using your double electric breast pump for 15-20 minutes to empty the breast 10. Power pumping once a day may be helpful for your supply. Pump for 20 minutes, rest for 10 minutes, pump for 10 minutes, rest for 10 minutes, pump for 10 minutes 11. Since Moringa and Fenugreek is not working, would be ok to stop 12. Keep up the good work 13. Thank you for allowing me to assist you today 14. Please call with any questions/concerns as needed [phone] 15. Follow up with Lactation in 2 weeks    Ed BlalockSharon S Nychelle Cassata RN, IBCLC                                                     Silas FloodSharon S Earley Grobe 03/20/2018, 10:09 AM

## 2018-04-03 ENCOUNTER — Ambulatory Visit: Payer: Self-pay

## 2018-04-03 NOTE — Lactation Note (Signed)
This note was copied from a baby's chart. Lactation Consultation Note  Patient Name: Haley Vang XBMWU'X Date: 04/03/2018   04/03/2018  Name: Haley Vang MRN: [id number] Date of Birth: 02/19/2019 Gestational Age: Gestational Age: [redacted]w[redacted]d Birth Weight: 137.6 oz Weight today:    11 pounds 0.8 ounces (5012 grams) with clean size 30 diaper  9 week old infant presents today with mom for follow up feeding assessment. Mom feels BF is going better.   Infant has gained 442 grams in the last 14 days with an average daily weight gain of 32 grams a day.   Mom started Reglan 10 days ago and has noticed a great increase in her milk supply. She has decreased the amount of formula needed to feed infant. Mom denies feelings of depression or change in her symptoms since starting.   Infant is feeding at the breast about every 4 hours. He is nursing for long periods and still needing supplement afterwards. Mom has been able to decrease formula usage to 1-4 ounces a day. Mom is pumping and volumes have increased significantly.   Infant tongue and lip have healed. Mom is still performing stretches, and is able to stop them per Dr. Orland Mustard. Mom is still performing suck training exercises.   Mom reports infant with less chomping on the bottle. Mom reports infant leaks with the Level 1 nipple. Infant still using Tommie Tippee Extra Slow Flow nipple and Avent Natural Flow Nipple for feeding.   Infant latched to both breast, infant more active at the breast. Infant transferring much better at the breast today. Infant choked once on the breast toward the end of the feeding. Nipples rounded post feeding. Infant finished feeding with a bottle.   Infant to follow up with Dr. Nash Dimmer next week. Mom aware of BF Support Groups. Infant to follow up with Lactation as needed. Mom to call with questions/concerns as needed.      General Information: Mother's reason for visit: Follow up feeding  assessment Consult: Follow-up Lactation consultant: Jasmine December Tameka Hoiland RN,IBCLC Breastfeeding experience: BF well, not transferring well, supplementing post BF Maternal medical conditions: History post partum depression Maternal medications: Pre-natal vitamin(Prozac, Abilify, Reglan 10 mg QID x 30 days)  Breastfeeding History: Frequency of breast feeding: every 4-5 hours, sleeping 6 hours stretches at night, about 30 minutes Duration of feeding: 20-30 minutes  Supplementation: Supplement method: bottle(Tommie Tippee Extra Slow Flow Nipple and Avent Natural Flow. Drools on level 1 nipples) Brand: Similac Formula volume: 1-4 ounces Formula frequency: 1-2 x a day Total formula volume per day: 1-4 ounces Breast milk volume: 2-5 ounces Breast milk frequency: 5-6 x a day   Pump type: Medela pump in style Pump frequency: 5 x a day, 1 power pumping about every day Pump volume: 3-4 ounces  Infant Output Assessment: Voids per 24 hours: 7-8 Urine color: Clear yellow Stools per 24 hours: 2 every 2-3 days Stool color: Yellow  Breast Assessment: Breast: Soft, Compressible Nipple: Erect Pain level: 0 Pain interventions: Bra  Feeding Assessment: Infant oral assessment: Variance Infant oral assessment comment: Infant tongue and lip healed. Infant with some chomping on the bottle and the breast but has improved. Positioning: Football(left breast, 15 minutes) Latch: 2 - Grasps breast easily, tongue down, lips flanged, rhythmical sucking. Audible swallowing: 2 - Spontaneous and intermittent Type of nipple: 2 - Everted at rest and after stimulation Comfort: 2 - Soft/non-tender Hold: 2 - No assistance needed to correctly position infant at breast LATCH score: 10 Latch assessment:  Deep Lips flanged: Yes Suck assessment: Displays both   Pre-feed weight: 5012 grams Post feed weight: 5064 grams Amount transferred: 52 ml Amount supplemented: 0  Additional Feeding Assessment: Infant oral  assessment: Variance Infant oral assessment comment: see above Positioning: Football(right breast) Latch: 2 - Grasps breast easily, tongue down, lips flanged, rhythmical sucking. Audible swallowing: 2 - Spontaneous and intermittent Type of nipple: 2 - Everted at rest and after stimulation Comfort: 2 - Soft/non-tender Hold: 2 - No assistance needed to correctly position infant at breast LATCH score: 10 Latch assessment: Deep Lips flanged: Yes Suck assessment: Displays both   Pre-feed weight: 5064 grams Post feed weight: 5102 grams Amount transferred: 38 ml Amount supplemented: 30 ml EBM via bottled  Totals: Total amount transferred: 90 ml Total supplement given: 30 ml EBM via bottle Total amount pumped post feed: 15 ml   Plan: 1. Offer infant the breast with feeding cues, feed on both breasts with each feeding 2. Keep infant awake with feeding as needed 3. Massage/compress breast with feedings to maximize milk flow 4. Empty the first breast before offering the second breast 5. Can use the SNS with feedings as you want. Use the Nipple shield as needed for pain with feeding 6. Offer infant a bottle of pumped breast milk after breast feeding, especially if he is cueing to feed.  7. Feed infant using the paced bottle method (video on [email or web address].com) 8. Infant needs about 94-125 ounces (3-4 ounces) for 8 feedings a day or [phone] ml (25-33 ounces) in 24 hours. Infant may eat more or less depending on how often he feeds. Feed him until he is satisfied 9. Continue pumping 5-6 x a day post breast feeding to protect milk supply. Pump using your double electric breast pump for 15-20 minutes to empty the breast 10. Power pumping once a day may be helpful for your supply. Pump for 20 minutes, rest for 10 minutes, pump for 10 minutes, rest for 10 minutes, pump for 10 minutes 11. Continue Reglan until end of the prescription as long as you are feeling well 12. Continue suck training exercises  until he is transferring better 13. Keep up the good work 14. Thank you for allowing me to assist you today 15. Please call with any questions/concerns as needed [phone] 16. Follow up with Lactation as needed   Ed Blalock RN, IBCLC                                                      Ed Blalock 04/03/2018, 8:37 AM

## 2018-04-18 ENCOUNTER — Telehealth (HOSPITAL_COMMUNITY): Payer: Self-pay | Admitting: Lactation Services

## 2018-04-18 NOTE — Telephone Encounter (Signed)
Mother called requesting to speak with either Noralee Stain RN IBCLC or Threasa Alpha RN IBCLC.  Relayed information to [facility] who stated she will contact her.

## 2018-05-19 ENCOUNTER — Telehealth (HOSPITAL_COMMUNITY): Payer: Self-pay

## 2018-05-19 NOTE — Telephone Encounter (Signed)
Patient had left message wanting Jasmine December or Tresa Endo to return call.  Telephone call to mom and she reports she was called. Patient denies need for anything at this time/urged her to call lactaiion as needed

## 2018-06-25 ENCOUNTER — Telehealth (HOSPITAL_COMMUNITY): Payer: Self-pay | Admitting: Lactation Services

## 2018-06-25 NOTE — Telephone Encounter (Signed)
Mother called with message wanting to speak to lactation consultant regarding infant being in 1% for weight and Peds MD wanting her to supplement with formula.  Passed message to [facility] who will call her today.

## 2018-07-30 ENCOUNTER — Other Ambulatory Visit: Payer: Self-pay

## 2018-07-30 ENCOUNTER — Other Ambulatory Visit: Payer: BC Managed Care – PPO

## 2018-07-30 DIAGNOSIS — Z20822 Contact with and (suspected) exposure to covid-19: Secondary | ICD-10-CM

## 2018-08-02 LAB — NOVEL CORONAVIRUS, NAA: SARS-CoV-2, NAA: NOT DETECTED

## 2018-08-22 ENCOUNTER — Ambulatory Visit: Payer: Self-pay

## 2018-08-22 NOTE — Lactation Note (Signed)
This note was copied from a baby's chart. Lactation Consultation Note  Patient Name: Haley Vang Pourciau ZOXWR'UToday's Date: 08/22/2018     08/22/2018  Name: Haley Vang Kai MRN: [id number] Date of Birth: 02/19/2019 Gestational Age: Gestational Age: 5642w1d Birth Weight: 137.6 oz Weight today:    14 pounds 13.4 ounces (6730 grams) with clean size 43 diaper  346 month old Infant presents today with mom for feeding assessment. Infant is BF about every 3 hours and sleeps about 10 hours at night. Infant is sitting and scooting across the floor. Mom is concerned her milk supply has decreased. Mom is planning to go back to work next week.   Infant is getting one bottle of pumped milk at night before bed. Mom is pumping once a night and is getting about 2 ounces per pumping.   Mom on Amoxicillin for sinus infection.   Mom has started Fenugreek 1800 mg TID since last night as she noted her supply decreased in the evening when pumping. Mom to talk with OB about resuming Reglan to see if that helps her supply. Mom had a good response with Reglan previously and did not experience any side effects.   Infant weight curve has increased per Ped and she is ok with the increase.   Infant is eating Oatmeal in the morning and fruits and vegetables during the day. Mom using EBM in Oatmeal and using her frozen milk to supplement infant.  Infant very happy and satisfied after feeding with mom today in the office. She is aware that the volume is most likely too low for infant. Discussed increasing his solids as he wants in addition to continuing BF and supplementation as she has been.    General Information: Mother's reason for visit: Feeding assessment, assess milk supply Consult: Follow-up Lactation consultant: Noralee StainSharon Cloy Cozzens RN,IBCLC Breastfeeding experience: feeding every 3 hours during the day and sleeps 10 hours at night   Maternal medications: Pre-natal vitamin(Abilify, Prozac, Fenugreek 1800 mg  TID)  Breastfeeding History: Frequency of breast feeding: every 3 hours during the day Duration of feeding: 10-15 minutes  Supplementation: Supplement method: bottle         Breast milk volume: 4 ounces Breast milk frequency: 1 x a day Total breast milk volume per day: 4 ounces plus BF Pump type: Medela pump in style Pump frequency: 1 x a day in the evening Pump volume: 2 ounces  Infant Output Assessment: Voids per 24 hours: 8+ Urine color: Clear yellow Stools per 24 hours: every other day Stool color: Brown  Breast Assessment: Breast: Soft, Compressible Nipple: Erect Pain level: 0 Pain interventions: Bra  Feeding Assessment: Infant oral assessment: WNL   Positioning: Football(both breasts, 15 minutes total) Latch: 2 - Grasps breast easily, tongue down, lips flanged, rhythmical sucking. Audible swallowing: 2 - Spontaneous and intermittent Type of nipple: 2 - Everted at rest and after stimulation Comfort: 2 - Soft/non-tender Hold: 2 - No assistance needed to correctly position infant at breast LATCH score: 10 Latch assessment: Deep Lips flanged: No Suck assessment: Displays both   Pre-feed weight: 6730 Post feed weight: 6810 Amount transferred: 80 ml Amount supplemented: 0  Additional Feeding Assessment:                                    Totals: Total amount transferred: 80 ml Total supplement given: 0 Total amount pumped post feed: 3 ml EBM   Plan:  1. Offer infant the breast with feeding cues, feed on both breasts with each feeding 2. Keep infant awake with feeding as needed 3. Massage/compress breast with feedings to maximize milk flow 4. Empty the first breast before offering the second breast 5. Continue with the Fenugreek 1800 mg three times a day to see if it helps with your supply. If no supply increase is noted within 3 days may need to consider restarting Reglan with permission of OB 6. Power pumping once a day may be helpful  for your supply. Pump for 20 minutes, rest for 10 minutes, pump for 10 minutes, rest for 10 minutes, pump for 10 minutes 7. Keep up the good work 8. Thank you for allowing me to assist you today 9. Please call with any questions/concerns as needed [phone] 10. Follow up with Lactationas needed  Nonah Mattes RN, [facility] Consultant RN, IBCLC                                                     Donn Pierini 08/22/2018, 3:35 PM

## 2018-08-23 ENCOUNTER — Telehealth (HOSPITAL_COMMUNITY): Payer: Self-pay | Admitting: Lactation Services

## 2018-08-23 NOTE — Telephone Encounter (Signed)
Haley Vang was interested in getting a message to [facility].  She wanted to update her on a visit she had with [facility].  Message sent to [facility].

## 2018-09-18 ENCOUNTER — Other Ambulatory Visit: Payer: Self-pay

## 2018-09-18 DIAGNOSIS — Z20822 Contact with and (suspected) exposure to covid-19: Secondary | ICD-10-CM

## 2018-09-19 LAB — NOVEL CORONAVIRUS, NAA: SARS-CoV-2, NAA: NOT DETECTED

## 2018-10-08 ENCOUNTER — Telehealth (HOSPITAL_COMMUNITY): Payer: Self-pay | Admitting: Lactation Services

## 2018-10-08 NOTE — Telephone Encounter (Signed)
Pt called and LM on Lact[facility] voicemail with questions about a hand pump. She did not answer when I returned call. LM for pt to call at her convenience.

## 2018-12-24 ENCOUNTER — Other Ambulatory Visit: Payer: Self-pay

## 2018-12-24 DIAGNOSIS — Z20822 Contact with and (suspected) exposure to covid-19: Secondary | ICD-10-CM

## 2018-12-27 LAB — NOVEL CORONAVIRUS, NAA: SARS-CoV-2, NAA: NOT DETECTED

## 2019-04-08 DIAGNOSIS — G43709 Chronic migraine without aura, not intractable, without status migrainosus: Secondary | ICD-10-CM | POA: Insufficient documentation

## 2019-04-08 DIAGNOSIS — F411 Generalized anxiety disorder: Secondary | ICD-10-CM | POA: Insufficient documentation

## 2019-04-12 ENCOUNTER — Ambulatory Visit: Payer: BC Managed Care – PPO | Attending: Internal Medicine

## 2019-04-12 DIAGNOSIS — Z23 Encounter for immunization: Secondary | ICD-10-CM | POA: Insufficient documentation

## 2019-04-12 NOTE — Progress Notes (Signed)
   Covid-19 Vaccination Clinic  Name:  Lalia Loudon    MRN: [id number] DOB: February 28, 1990  04/12/2019  Ms. Larock was observed post Covid-19 immunization for 15 minutes without incidence. She was provided with Vaccine Information Sheet and instruction to access the V-Safe system.   Ms. Greaves was instructed to call 911 with any severe reactions post vaccine: Marland Kitchen Difficulty breathing  . Swelling of your face and throat  . A fast heartbeat  . A bad rash all over your body  . Dizziness and weakness    Immunizations Administered    Name Date Dose VIS Date Route   Moderna COVID-19 Vaccine 04/12/2019  2:51 PM 0.5 mL 01/14/2019 Intramuscular   Manufacturer: Moderna   Lot: [id number]   NDC: [id number]

## 2019-05-10 ENCOUNTER — Ambulatory Visit: Payer: BC Managed Care – PPO | Attending: Internal Medicine

## 2019-06-11 ENCOUNTER — Ambulatory Visit
Admission: RE | Admit: 2019-06-11 | Discharge: 2019-06-11 | Disposition: A | Discharge status: Home / Self Care | Payer: BC Managed Care – PPO | Source: Ambulatory Visit | Attending: Family Medicine | Admitting: Family Medicine

## 2019-06-11 ENCOUNTER — Other Ambulatory Visit (HOSPITAL_COMMUNITY): Payer: Self-pay | Admitting: Family Medicine

## 2019-06-11 ENCOUNTER — Other Ambulatory Visit: Payer: Self-pay

## 2019-06-11 ENCOUNTER — Other Ambulatory Visit: Payer: Self-pay | Admitting: Family Medicine

## 2019-06-11 DIAGNOSIS — R7989 Other specified abnormal findings of blood chemistry: Secondary | ICD-10-CM | POA: Diagnosis present

## 2019-06-11 DIAGNOSIS — R0602 Shortness of breath: Secondary | ICD-10-CM

## 2019-06-11 DIAGNOSIS — R079 Chest pain, unspecified: Secondary | ICD-10-CM | POA: Insufficient documentation

## 2019-06-11 MED ORDER — IOHEXOL 350 MG/ML SOLN
75.0000 mL | Freq: Once | INTRAVENOUS | Status: AC | PRN
  Administered 2019-06-11: 16:00:00 75 mL via INTRAVENOUS

## 2020-10-05 DIAGNOSIS — D485 Neoplasm of uncertain behavior of skin: Secondary | ICD-10-CM | POA: Diagnosis not present

## 2020-10-05 DIAGNOSIS — D225 Melanocytic nevi of trunk: Secondary | ICD-10-CM | POA: Diagnosis not present

## 2020-11-23 DIAGNOSIS — Z713 Dietary counseling and surveillance: Secondary | ICD-10-CM | POA: Diagnosis not present

## 2020-12-07 DIAGNOSIS — Z713 Dietary counseling and surveillance: Secondary | ICD-10-CM | POA: Diagnosis not present

## 2020-12-28 DIAGNOSIS — Z713 Dietary counseling and surveillance: Secondary | ICD-10-CM | POA: Diagnosis not present

## 2020-12-31 DIAGNOSIS — F339 Major depressive disorder, recurrent, unspecified: Secondary | ICD-10-CM | POA: Insufficient documentation

## 2020-12-31 DIAGNOSIS — E559 Vitamin D deficiency, unspecified: Secondary | ICD-10-CM | POA: Insufficient documentation

## 2020-12-31 DIAGNOSIS — Z20822 Contact with and (suspected) exposure to covid-19: Secondary | ICD-10-CM | POA: Diagnosis not present

## 2020-12-31 DIAGNOSIS — G4733 Obstructive sleep apnea (adult) (pediatric): Secondary | ICD-10-CM | POA: Insufficient documentation

## 2020-12-31 DIAGNOSIS — J4520 Mild intermittent asthma, uncomplicated: Secondary | ICD-10-CM | POA: Diagnosis not present

## 2021-01-25 DIAGNOSIS — Z713 Dietary counseling and surveillance: Secondary | ICD-10-CM | POA: Diagnosis not present

## 2021-02-15 DIAGNOSIS — T7840XA Allergy, unspecified, initial encounter: Secondary | ICD-10-CM | POA: Diagnosis not present

## 2021-02-17 DIAGNOSIS — Z713 Dietary counseling and surveillance: Secondary | ICD-10-CM | POA: Diagnosis not present

## 2021-02-24 DIAGNOSIS — J45990 Exercise induced bronchospasm: Secondary | ICD-10-CM | POA: Diagnosis not present

## 2021-02-24 DIAGNOSIS — L500 Allergic urticaria: Secondary | ICD-10-CM | POA: Diagnosis not present

## 2021-02-24 DIAGNOSIS — T7840XA Allergy, unspecified, initial encounter: Secondary | ICD-10-CM | POA: Diagnosis not present

## 2021-02-24 DIAGNOSIS — H1013 Acute atopic conjunctivitis, bilateral: Secondary | ICD-10-CM | POA: Diagnosis not present

## 2021-03-01 IMAGING — CT CT ANGIO CHEST
2 of 6 series · 18 of 46 positions shown · IV contrast (omnipaque)
Comparison: None.

CLINICAL DATA: Right-sided chest pain for several hours

EXAM:
CT ANGIOGRAPHY CHEST WITH CONTRAST
TECHNIQUE: Multidetector CT imaging of the chest was performed using the
standard protocol during bolus administration of intravenous
contrast. Multiplanar CT image reconstructions and MIPs were
obtained to evaluate the vascular anatomy.
CONTRAST:  75mL OMNIPAQUE IOHEXOL 350 MG/ML SOLN

[Series 7: thins · axial · 0.76mm/px · z∈[+197,+450]mm · 15 of 279 slices shown]
[im 13/279  lung]
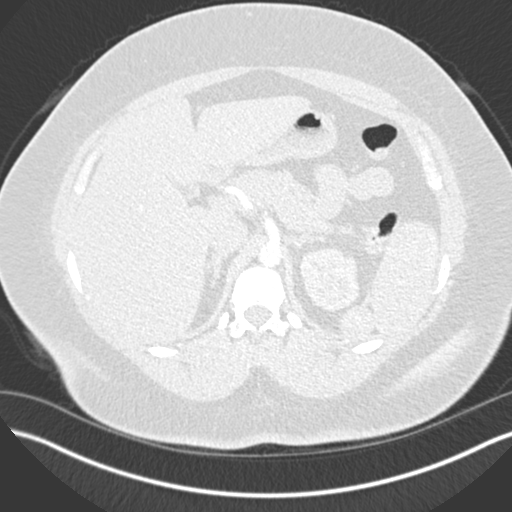
[im 37/279  soft-tissue]
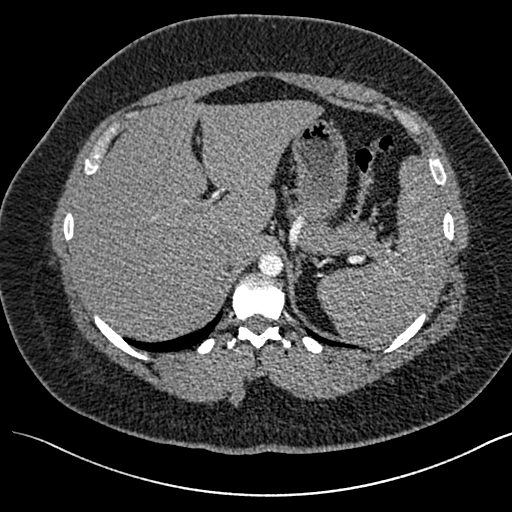
[im 49/279  lung]
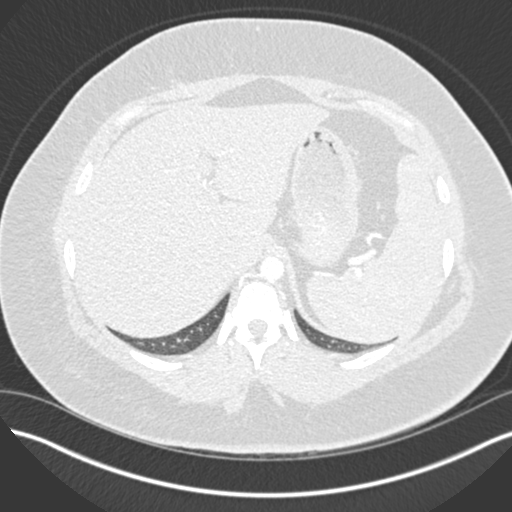
[im 73/279  soft-tissue]
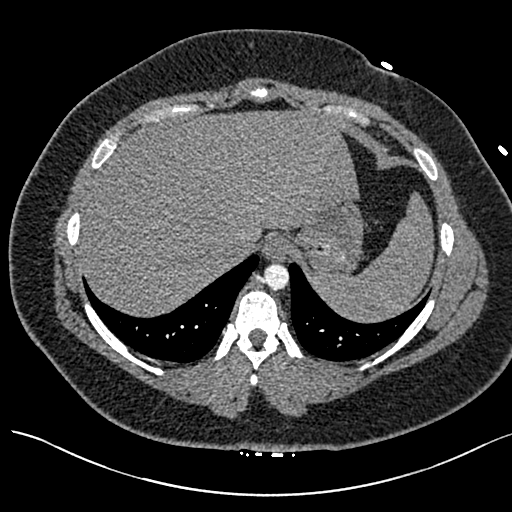
[im 85/279  lung]
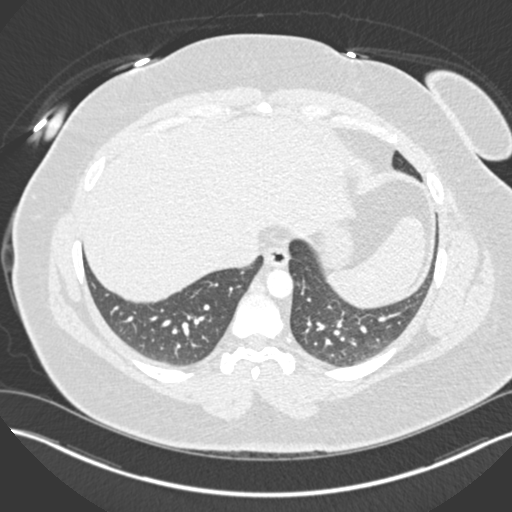
[im 109/279  soft-tissue]
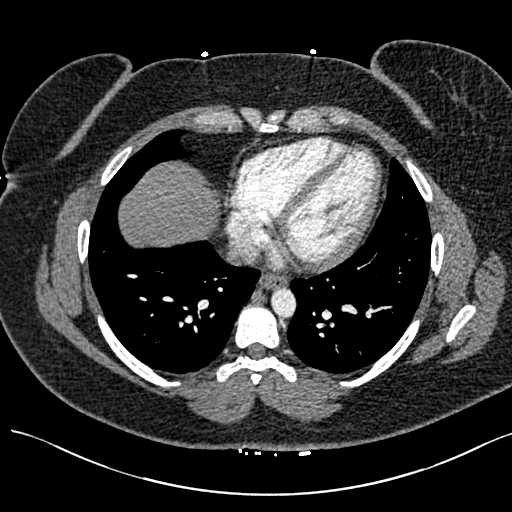
[im 121/279  lung]
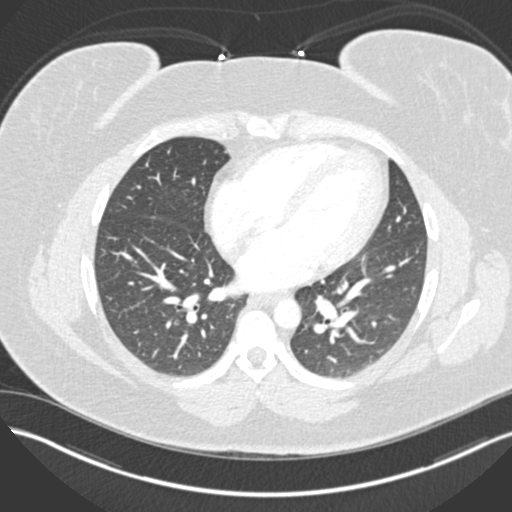
[im 146/279  soft-tissue]
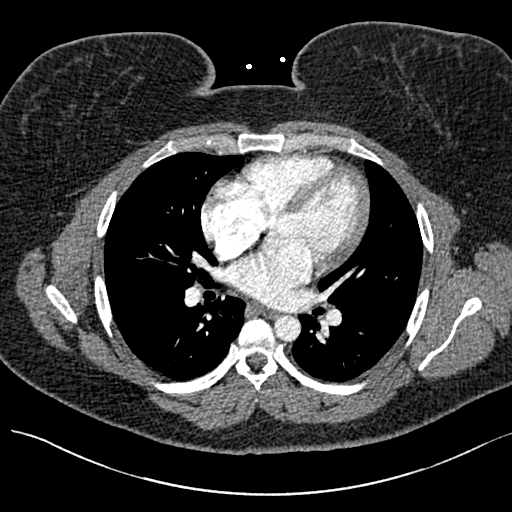
[im 158/279  lung]
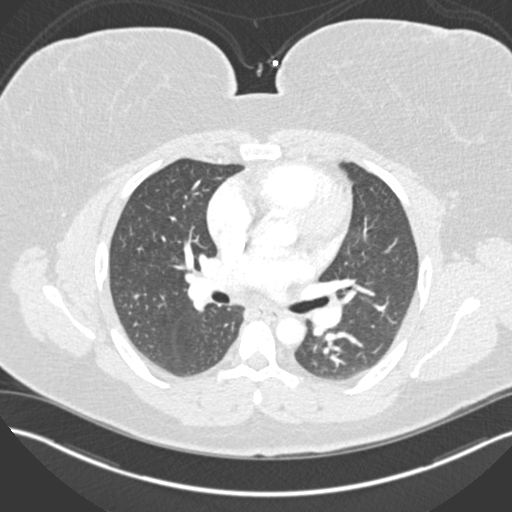
[im 170/279  soft-tissue]
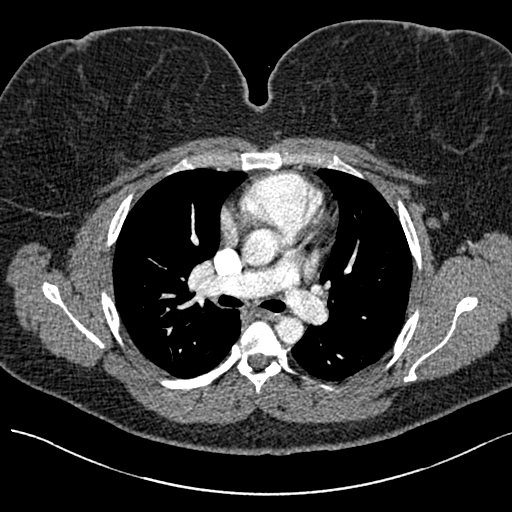
[im 194/279  lung]
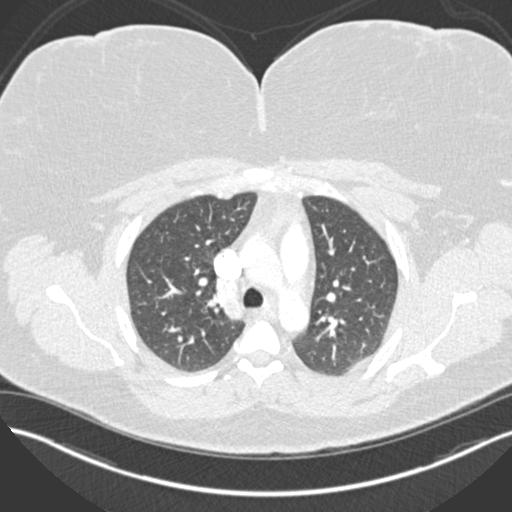
[im 206/279  soft-tissue]
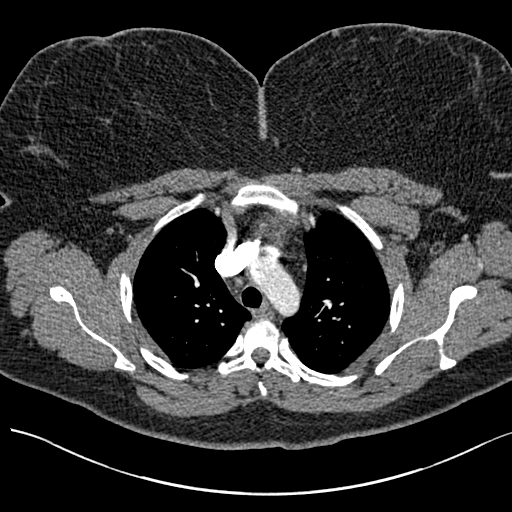
[im 230/279  lung]
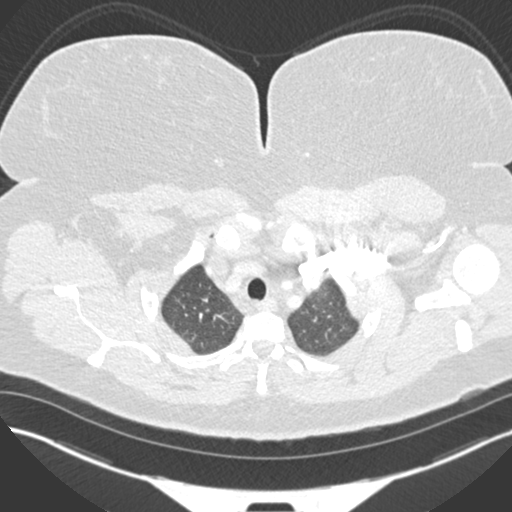
[im 242/279  soft-tissue]
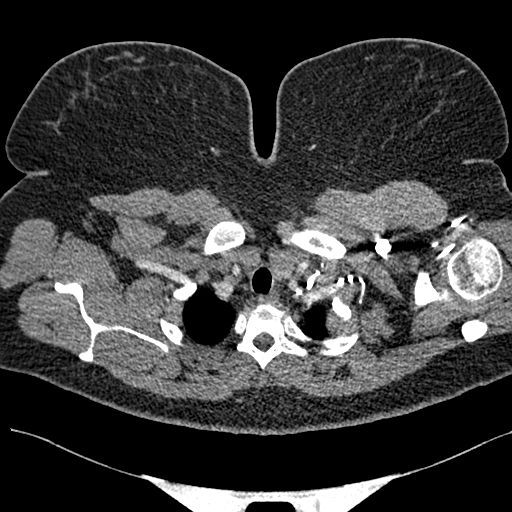
[im 266/279  lung]
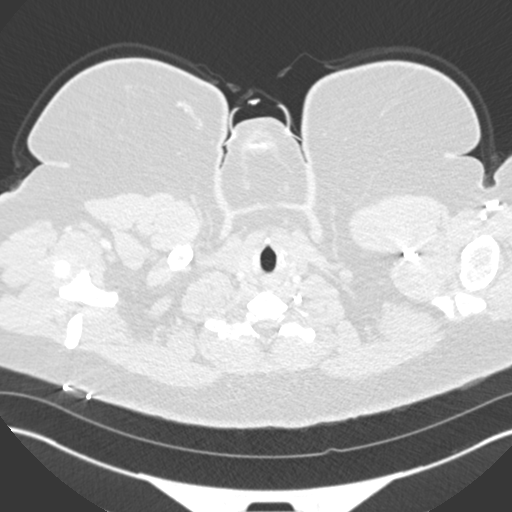

[Series 9: coronal mpr · coronal · 0.54mm/px · 3 of 86 slices shown]
[im 22/86  soft-tissue]
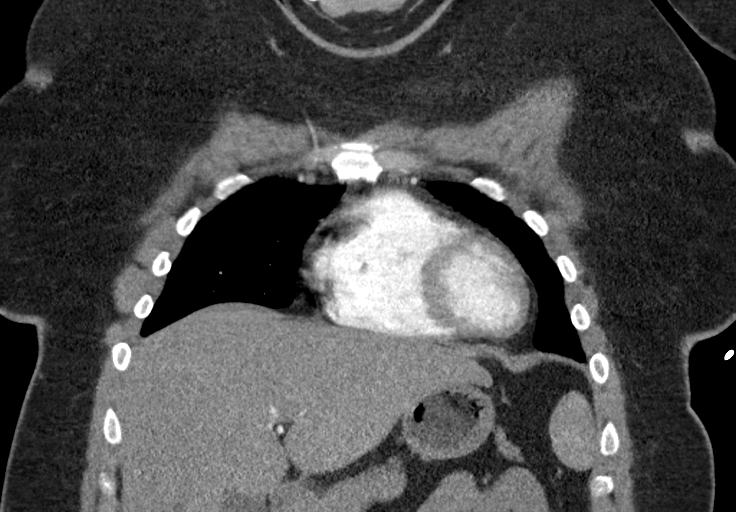
[im 43/86  soft-tissue]
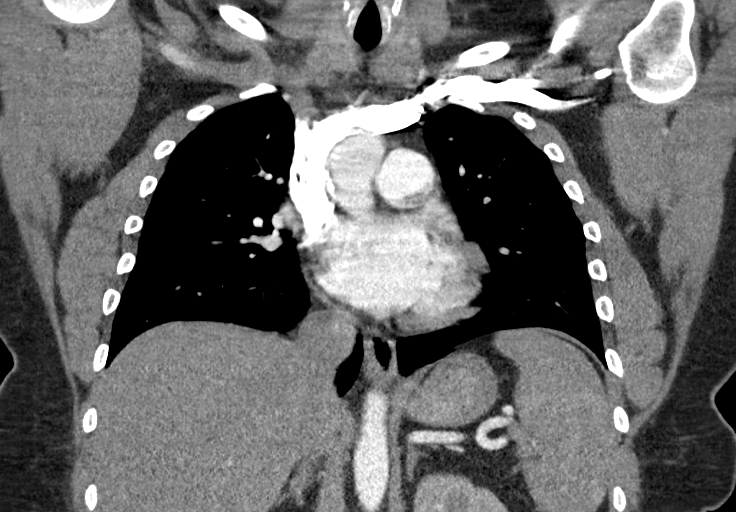
[im 64/86  soft-tissue]
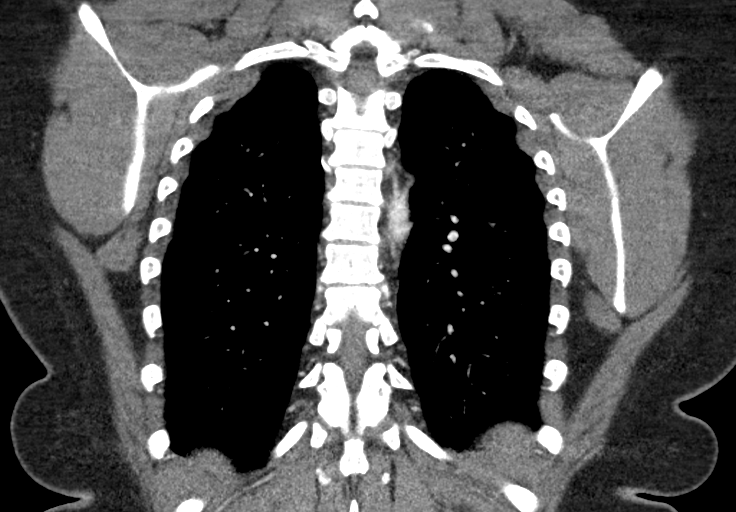

[18 of 46 positions shown; findings below may reference images not displayed]

FINDINGS: Cardiovascular: Thoracic aorta and its branches are within normal
limits. Heart is at the upper limits of normal in size. No coronary
calcifications are seen. The pulmonary artery shows a normal
branching pattern without intraluminal filling defect to suggest
pulmonary embolism.

Mediastinum/Nodes: Thoracic inlet is within normal limits. No hilar
or mediastinal adenopathy is noted. The esophagus as visualized is
within normal limits.

Lungs/Pleura: Lungs are well aerated bilaterally. No focal
infiltrate or sizable effusion is seen. No parenchymal nodules are
noted.

Upper Abdomen: Visualized upper abdomen is within normal limits.

Musculoskeletal: No chest wall abnormality. No acute or significant
osseous findings.

Review of the MIP images confirms the above findings.
IMPRESSION: No evidence of pulmonary emboli.

No acute abnormality noted.

These results will be called to the ordering clinician or
representative by the Radiologist Assistant, and communication
documented in the PACS or [REDACTED].

## 2021-03-03 DIAGNOSIS — Z713 Dietary counseling and surveillance: Secondary | ICD-10-CM | POA: Diagnosis not present

## 2021-03-23 DIAGNOSIS — J989 Respiratory disorder, unspecified: Secondary | ICD-10-CM | POA: Insufficient documentation

## 2022-03-09 ENCOUNTER — Other Ambulatory Visit (HOSPITAL_COMMUNITY): Payer: Self-pay | Admitting: Orthopaedic Surgery

## 2022-03-09 DIAGNOSIS — M79671 Pain in right foot: Secondary | ICD-10-CM

## 2022-03-09 DIAGNOSIS — M25571 Pain in right ankle and joints of right foot: Secondary | ICD-10-CM

## 2022-03-17 ENCOUNTER — Ambulatory Visit (HOSPITAL_BASED_OUTPATIENT_CLINIC_OR_DEPARTMENT_OTHER)
Admission: RE | Admit: 2022-03-17 | Discharge: 2022-03-17 | Disposition: A | Discharge status: Home / Self Care | Payer: BC Managed Care – PPO | Source: Ambulatory Visit | Attending: Orthopaedic Surgery | Admitting: Orthopaedic Surgery

## 2022-03-17 DIAGNOSIS — M25571 Pain in right ankle and joints of right foot: Secondary | ICD-10-CM

## 2022-03-17 DIAGNOSIS — M79671 Pain in right foot: Secondary | ICD-10-CM | POA: Diagnosis present

## 2022-03-30 ENCOUNTER — Encounter (HOSPITAL_COMMUNITY): Payer: Self-pay

## 2022-03-30 ENCOUNTER — Ambulatory Visit (HOSPITAL_COMMUNITY): Payer: BC Managed Care – PPO

## 2022-05-02 ENCOUNTER — Encounter (HOSPITAL_BASED_OUTPATIENT_CLINIC_OR_DEPARTMENT_OTHER): Payer: Self-pay | Admitting: Physical Therapy

## 2022-05-02 ENCOUNTER — Other Ambulatory Visit: Payer: Self-pay

## 2022-05-02 ENCOUNTER — Ambulatory Visit (HOSPITAL_BASED_OUTPATIENT_CLINIC_OR_DEPARTMENT_OTHER): Payer: BC Managed Care – PPO | Attending: Orthopaedic Surgery | Admitting: Physical Therapy

## 2022-05-02 DIAGNOSIS — M65871 Other synovitis and tenosynovitis, right ankle and foot: Secondary | ICD-10-CM | POA: Diagnosis not present

## 2022-05-02 DIAGNOSIS — R262 Difficulty in walking, not elsewhere classified: Secondary | ICD-10-CM | POA: Insufficient documentation

## 2022-05-02 DIAGNOSIS — M25371 Other instability, right ankle: Secondary | ICD-10-CM | POA: Diagnosis not present

## 2022-05-02 DIAGNOSIS — M25571 Pain in right ankle and joints of right foot: Secondary | ICD-10-CM | POA: Insufficient documentation

## 2022-05-02 DIAGNOSIS — M65861 Other synovitis and tenosynovitis, right lower leg: Secondary | ICD-10-CM | POA: Diagnosis not present

## 2022-05-02 DIAGNOSIS — M6281 Muscle weakness (generalized): Secondary | ICD-10-CM | POA: Insufficient documentation

## 2022-05-02 NOTE — Therapy (Signed)
OUTPATIENT PHYSICAL THERAPY LOWER EXTREMITY EVALUATION   Patient Name: Haley Vang MRN: [id number] DOB:May 01, 1990, 32 y.o., female Today's Date: 05/02/2022  END OF SESSION:  PT End of Session - 05/02/22 1535     Visit Number 1    Number of Visits 16    Date for PT Re-Evaluation 07/31/22    Authorization Type BCBS    PT Start Time 1530    PT Stop Time 1615    PT Time Calculation (min) 45 min    Activity Tolerance Patient tolerated treatment well    Behavior During Therapy WFL for tasks assessed/performed             Past Medical History:  Diagnosis Date   Anxiety    Asthma    last used inhaler 1.5 months ago   Depression    Headache    Past Surgical History:  Procedure Laterality Date   WISDOM TOOTH EXTRACTION     Patient Active Problem List   Diagnosis Date Noted   Term pregnancy 02/19/2018   No leakage of amniotic fluid into vagina 01/21/2018   Pelvic pressure in pregnancy, antepartum, second trimester 11/18/2017    PCP: [facility] [place]   REFERRING PROVIDER: Erle Crocker, MD  REFERRING DIAG: Diagnosis Description Right ankle instability, peroneal tenosynovitis, achilles stiffness, lateral overload and forefoot pain  THERAPY DIAG:  Pain in right ankle and joints of right foot  Muscle weakness (generalized)  Difficulty walking  Rationale for Evaluation and Treatment: Rehabilitation  ONSET DATE: September 2023  SUBJECTIVE:     SUBJECTIVE STATEMENT:  Pt goes by [facility]   Pt states that ankle is unstable and weak but the foot pain is the main priority. Pt states she tried line dancing and had pain directly after. Pt has a weak ankle with frayed ligaments but the ankle does not hurt at all. Pt has history of chronic ankle sprains starting from childhood and played VB in college. "My foot is what is killing me." "We are at a loss" and aquatic therapy is the last effort. Pt states that MD thinks surgery can fix ankle  but they are unsure of why she is still having pain. She has had casting, boots, anti-inflammatories, injections all have not made a difference. Has tried a lateral wedge and shoes without change. Ankle quickly fatigues and swells on the top.  Pt is R side dominant; Pt has NT into the R foot with sitting into DF.  Needs UE for stairs due to ankle weakness but no increase in pain. Plantar fasciitis has started back again in the last week and half. Hurts with foot hitting ground with sitting or first thing in the morning. Pt denies cancer red flags. Denies L/S.  Pt has access to the pool. No fear of water.   Pt has done PT in the past. She has done marble pick up, calf stretching, ankle 4 way,  towel scrunches toe yoga, and DN.      PERTINENT HISTORY: Asthma, anxiety, plantar fascitis  PAIN:  Are you having pain? Yes: NPRS scale: 1/10; 10/10 at worst Pain location: outer pinky toe on R and  anterior cuneiforms on R  Pain description: sharp achy  Aggravating factors: touching, standing on it all day, continued walking,  Relieving factors: rest  PRECAUTIONS: None  WEIGHT BEARING RESTRICTIONS: No  FALLS:  Has patient fallen in last 6 months? 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 Minimal degenerative findings at the dorsal side of the articulation between the middle cuneiform and the base of the second metatarsal. 2. Bifid medial sesamoid, no findings of sesamoiditis  IMPRESSION: 1. Mild common peroneus tendon sheath tenosynovitis just below the lateral malleolus. 2. Mild tibialis posterior tenosynovitis. 3. Borderline  thickening of the superomedial portion of the spring ligament, with some accentuated T2 signal in the inferoplantar longitudinal portion of the spring ligament raising the possibility of partial tearing. 4. Mild indistinctness/accentuated signal within and along the ATFL, suspicious for a prior ATFL injury. No substantial local synovitis to suggest anterolateral impingement. 5. Small plantar calcaneal spur with borderline thickening of the medial band of the plantar fascia. Correlate clinically in assessing for mild plantar fasciitis.  PATIENT SURVEYS:  FOTO 31 60 @ DC 19 pts  COGNITION: Overall cognitive status: Within functional limits for tasks assessed     SENSATION: WFL  POSTURE:  toe out, hip ER position  PALPATION: TTP of lateral foot insertion site; dorsal aspect medial cuneiforms, insertion of achilles    LOWER EXTREMITY ROM:  Active ROM Right eval Left eval  Hip flexion [facility]  Hip extension    Hip abduction    Hip adduction    Hip internal rotation    Hip external rotation [facility]  Knee flexion [facility] WFL  Knee extension [facility] WFL  Ankle dorsiflexion -3 0  Ankle plantarflexion 45 45  Ankle inversion 10 15  Ankle eversion 15 20   (Blank rows = not tested)  Lack of great toe extension: 10 deg with PROM  LOWER EXTREMITY MMT:  MMT Right eval Left eval  Ankle dorsiflexion 4+/5 4+/5  Ankle plantarflexion 4+/5 4+/5  Ankle inversion    Ankle eversion     (Blank rows = not tested)  LOWER EXTREMITY SPECIAL TESTS:  Ankle special tests: Tibial torsion test: positive , Anterior drawer test: positive , Great toe extension test: positive , and Dorsiflexion-Eversion test: negative   FUNCTIONAL TESTS:  SLS: ankle instability of the R; decreased arch height; toe out position  GAIT: Distance walked: 78ft Assistive device utilized: None Level of assistance: Complete Independence Comments: toe out position   TODAY'S TREATMENT:                                                                                                                               DATE: 3/19   Program Notes Try heel lift in [employer]  Exercises - Gastroc Stretch on Wall  - 2 x daily - 7 x weekly - 1 sets - 3 reps - 30 hold - Seated Self Great Toe Stretch  - 2  x daily - 7 x weekly - 1 sets - 10 reps - 5 hold - Woodpeckers Two Legs  - 1 x daily - 7 x weekly - 2 sets - 10 reps - 2 hold  PATIENT EDUCATION:  Education details: MOI, diagnosis, prognosis, anatomy, exercise progression, DOMS expectations, muscle firing,  envelope of function, HEP, POC  Person educated: Patient Education method: Explanation, Demonstration, Tactile cues, Verbal cues, and Handouts Education comprehension: verbalized understanding, returned demonstration, verbal cues required, tactile cues required, and needs further education  HOME EXERCISE PROGRAM: Access Code: [email or web address] URL: [email or web address] Date: 05/02/2022 Prepared by: Daleen Bo   ASSESSMENT:  CLINICAL IMPRESSION:  Patient is a 32 y.o. female who was seen today for physical therapy evaluation and treatment for c/c of R foot pain and ankle instability. Pt's s/s appear consistent with pain due to history of recurrent ankle sprains as well as sagittal plane ankle DF ROM limitation. Pt has ostephyte formation on dorsal aspect of cuneiform due to excessive pressure as well as concurrent plantar fascia pain. Pt did seem to have good response to ankle joint mobs. Pt given heel lifts to offload R ankle DF limitations but made aware of potential increase in lateral ankle instability due to combined IV and PF motion. Pt's pain is highly sensitive and irritable with movement. Pt's is more pain dominant at this time. Plan to continue with ankle ROM and stability at future sessions. Pt would benefit from continued skilled therapy in order to reach goals and maximize functional R ankle strength and ROM for full return to PLOF. Marland Kitchen    OBJECTIVE  IMPAIRMENTS: decreased activity tolerance, decreased mobility, difficulty walking, decreased ROM, decreased strength, hypomobility, increased muscle spasms, impaired flexibility, improper body mechanics, and pain.    ACTIVITY LIMITATIONS: carrying, lifting, sitting, squatting, sleeping, transfers, and locomotion level   PARTICIPATION LIMITATIONS: meal prep, cleaning, laundry, shopping, community activity, occupation, and exercise   PERSONAL FACTORS: Time since onset of injury/illness/exacerbation are also affecting patient's functional outcome.    REHAB POTENTIAL: Good   CLINICAL DECISION MAKING: Stable/uncomplicated   EVALUATION COMPLEXITY: Low     GOALS:     SHORT TERM GOALS: Target date: 06/13/2022      Pt will become independent with HEP in order to demonstrate synthesis of PT education.   Goal status: INITIAL   2.  Pt will score at least 19 pt increase on FOTO to demonstrate functional improvement in MCII and pt perceived function.     Goal status: INITIAL   3.  Pt will be able to demonstrate/report ability to sit/stand/sleep for extended periods of time without pain in order to demonstrate functional improvement and tolerance to static positioning.    Goal status: INITIAL     LONG TERM GOALS: Target date: 07/25/2022     Pt  will become independent with final HEP in order to demonstrate synthesis of PT education.     Goal status: INITIAL   2.  Pt will score >/= 62 on FOTO to demonstrate improvement in perceived lumbar function.    Goal status: INITIAL   3.  Pt will be able to demonstrate ability to perform stairs without pain  in order to demonstrate functional improvement in LE function for self-care and house hold duties.      Goal status: INITIAL   4.  Pt will be able to demonstrate/report ability to walk >20 mins without pain in order to demonstrate functional improvement and tolerance to exercise and community mobility.    Goal  status: INITIAL      PLAN:   PT FREQUENCY: 1-2x/week   PT DURATION: 12 weeks (likely DC in 8)   PLANNED INTERVENTIONS: Therapeutic exercises, Therapeutic activity, Neuromuscular re-education, Balance training, Gait training, Patient/Family education, Self Care, Joint mobilization, Joint manipulation, Orthotic/Fit training, Aquatic Therapy, Dry Needling, Electrical stimulation, Spinal manipulation, Spinal mobilization, Cryotherapy, Moist heat, scar mobilization, Splintting, Taping, Vasopneumatic device, Traction, Ultrasound, Ionotophoresis 4mg /ml Dexamethasone, Manual therapy, and Re-evaluation.   PLAN FOR NEXT SESSION: R ankle TCJ post glides, STM fibularis group, airex balance, heel raise at steps, revisit footwear   Daleen Bo, PT 05/02/2022, 5:32 PM

## 2022-05-04 ENCOUNTER — Encounter (HOSPITAL_BASED_OUTPATIENT_CLINIC_OR_DEPARTMENT_OTHER): Payer: Self-pay

## 2022-05-04 ENCOUNTER — Ambulatory Visit (HOSPITAL_BASED_OUTPATIENT_CLINIC_OR_DEPARTMENT_OTHER): Payer: BC Managed Care – PPO

## 2022-05-04 DIAGNOSIS — M25571 Pain in right ankle and joints of right foot: Secondary | ICD-10-CM | POA: Diagnosis not present

## 2022-05-04 DIAGNOSIS — R262 Difficulty in walking, not elsewhere classified: Secondary | ICD-10-CM

## 2022-05-04 DIAGNOSIS — M6281 Muscle weakness (generalized): Secondary | ICD-10-CM

## 2022-05-04 NOTE — Therapy (Addendum)
OUTPATIENT PHYSICAL THERAPY LOWER EXTREMITY  PHYSICAL THERAPY DISCHARGE SUMMARY  Visits from Start of Care: 2  Current functional level related to goals / functional outcomes: Unknown    Remaining deficits: Unknown   Education / Equipment: Initial edu   Patient agrees to discharge. Patient goals were not met. Patient is being discharged due to not returning since the last visit.  Addend Corrie Dandy Tomma Lightning) Ziemba MPT 04/05/23 11:39 AM [facility] [address] [place], [place], [address] Phone: [phone]   Fax:  [phone]     Patient Name: Haley Vang MRN: [id number] DOB:January 30, 1991, 32 y.o., female Today's Date: 05/04/2022  END OF SESSION:  PT End of Session - 05/04/22 1120     Visit Number 2    Number of Visits 16    Date for PT Re-Evaluation 07/31/22    Authorization Type BCBS    PT Start Time 0930    PT Stop Time 1012    PT Time Calculation (min) 42 min    Activity Tolerance Patient tolerated treatment well    Behavior During Therapy WFL for tasks assessed/performed              Past Medical History:  Diagnosis Date   Anxiety    Asthma    last used inhaler 1.5 months ago   Depression    Headache    Past Surgical History:  Procedure Laterality Date   WISDOM TOOTH EXTRACTION     Patient Active Problem List   Diagnosis Date Noted   Term pregnancy 02/19/2018   No leakage of amniotic fluid into vagina 01/21/2018   Pelvic pressure in pregnancy, antepartum, second trimester 11/18/2017    PCP: [facility] [place]   REFERRING PROVIDER: Terance Hart, MD  REFERRING DIAG: Diagnosis Description Right ankle instability, peroneal tenosynovitis, achilles stiffness, lateral overload and forefoot pain  THERAPY DIAG:  Pain in right ankle and joints of right foot  Muscle weakness (generalized)  Difficulty walking  Rationale for Evaluation and Treatment:  Rehabilitation  ONSET DATE: September 2023  SUBJECTIVE:     SUBJECTIVE STATEMENT:  Pt goes by Haley Vang    Pt reports no changes after placement of heel lift at IE. She denies any change in pain level recently.    EVAL:  Pt states that ankle is unstable and weak but the foot pain is the main priority. Pt states she tried line dancing and had pain directly after. Pt has a weak ankle with frayed ligaments but the ankle does not hurt at all. Pt has history of chronic ankle sprains starting from childhood and played VB in college. "My foot is what is killing me." "We are at a loss" and aquatic therapy is the last effort. Pt states that MD thinks surgery can fix ankle but they are unsure of why she is still having pain. She has had casting, boots, anti-inflammatories, injections all have not made a difference. Has tried a lateral wedge and shoes without change. Ankle quickly fatigues and swells on the top.  Pt is R side dominant; Pt has NT into the R foot with sitting into DF.  Needs UE for stairs due to ankle weakness but no increase in pain. Plantar fasciitis has started back again in the last week and half. Hurts with foot hitting ground with sitting or first thing in the morning. Pt denies cancer red flags. Denies L/S.  Pt has access to the pool. No fear of water.   Pt has  done PT in the past. She has done marble pick up, calf stretching, ankle 4 way,  towel scrunches toe yoga, and DN.      PERTINENT HISTORY: Asthma, anxiety, plantar fascitis  PAIN:  Are you having pain? Yes: NPRS scale: 1/10; 10/10 at worst Pain location: outer pinky toe on R and  anterior cuneiforms on R  Pain description: sharp achy  Aggravating factors: touching, standing on it all day, continued walking,  Relieving factors: rest  PRECAUTIONS: None  WEIGHT BEARING RESTRICTIONS: No  FALLS:  Has patient fallen in last 6 months? 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 Minimal degenerative findings at the dorsal side of the articulation between the middle cuneiform and the base of the second metatarsal. 2. Bifid medial sesamoid, no findings of sesamoiditis  IMPRESSION: 1. Mild common peroneus tendon sheath tenosynovitis just below the lateral malleolus. 2. Mild tibialis posterior tenosynovitis. 3. Borderline thickening of the superomedial portion of the spring ligament, with some accentuated T2 signal in the inferoplantar longitudinal portion of the spring ligament raising the possibility of partial tearing. 4. Mild indistinctness/accentuated signal within and along the ATFL, suspicious for a prior ATFL injury. No substantial local synovitis to suggest anterolateral impingement. 5. Small plantar calcaneal spur with borderline thickening of the medial band of the plantar fascia. Correlate clinically in assessing for mild plantar fasciitis.  PATIENT SURVEYS:  FOTO 31 60 @ DC 19 pts  COGNITION: Overall cognitive status: Within functional limits for tasks assessed     SENSATION: WFL  POSTURE:  toe out, hip ER position  PALPATION: TTP of lateral foot insertion site; dorsal aspect medial cuneiforms, insertion of achilles    LOWER EXTREMITY ROM:  Active ROM Right eval Left eval  Hip flexion [facility]  Hip extension    Hip abduction    Hip adduction    Hip internal rotation    Hip external rotation [facility]  Knee flexion [facility] WFL  Knee extension [facility] WFL  Ankle dorsiflexion -3 0  Ankle plantarflexion 45 45  Ankle inversion 10 15  Ankle eversion 15 20   (Blank rows = not tested)  Lack of great toe extension: 10  deg with PROM  LOWER EXTREMITY MMT:  MMT Right eval Left eval  Ankle dorsiflexion 4+/5 4+/5  Ankle plantarflexion 4+/5 4+/5  Ankle inversion    Ankle eversion     (Blank rows = not tested)  LOWER EXTREMITY SPECIAL TESTS:  Ankle special tests: Tibial torsion test: positive , Anterior drawer test: positive , Great toe extension test: positive , and Dorsiflexion-Eversion test: negative   FUNCTIONAL TESTS:  SLS: ankle instability of the R; decreased arch height; toe out position  GAIT: Distance walked: 42ft Assistive device utilized: None Level of assistance: Complete Independence Comments: toe out position   TODAY'S TREATMENT:  DATE: 3/19   Program Notes Try heel lift in [place]  Exercises - Gastroc Stretch on Wall  - 2 x daily - 7 x weekly - 1 sets - 3 reps - 30 hold - Seated Self Great Toe Stretch  - 2 x daily - 7 x weekly - 1 sets - 10 reps - 5 hold - Woodpeckers Two Legs  - 1 x daily - 7 x weekly - 2 sets - 10 reps - 2 hold  PATIENT EDUCATION:  Education details: MOI, diagnosis, prognosis, anatomy, exercise progression, DOMS expectations, muscle firing,  envelope of function, HEP, POC  Person educated: Patient Education method: Explanation, Demonstration, Tactile cues, Verbal cues, and Handouts Education comprehension: verbalized understanding, returned demonstration, verbal cues required, tactile cues required, and needs further education  HOME EXERCISE PROGRAM: Access Code: [email or web address] URL: [email or web address] Date: 05/02/2022 Prepared by: Zebedee Iba   ASSESSMENT:  CLINICAL IMPRESSION:  Pt responds well to TC joint mobilizations with  posterior glide. Passive DF is non painful. Does have pain with manual pressure over 5th MT head. She is tender throughout length of peroneals so spent time on soft tissue mobilization to this  area. No pain reported with calf stretch off step. Pain with active eversion and manually resisted eversion. Pt questioned whether orthotics with benefit her as MD recommended this in the past. She may try orthotics to provide arch support, however they are unlikely to aid with her current sx. Will monitor peroneal tightness and 5th MT head pain for any changes.    OBJECTIVE IMPAIRMENTS: decreased activity tolerance, decreased mobility, difficulty walking, decreased ROM, decreased strength, hypomobility, increased muscle spasms, impaired flexibility, improper body mechanics, and pain.    ACTIVITY LIMITATIONS: carrying, lifting, sitting, squatting, sleeping, transfers, and locomotion level   PARTICIPATION LIMITATIONS: meal prep, cleaning, laundry, shopping, community activity, occupation, and exercise   PERSONAL FACTORS: Time since onset of injury/illness/exacerbation are also affecting patient's functional outcome.    REHAB POTENTIAL: Good   CLINICAL DECISION MAKING: Stable/uncomplicated   EVALUATION COMPLEXITY: Low     GOALS:     SHORT TERM GOALS: Target date: 06/13/2022      Pt will become independent with HEP in order to demonstrate synthesis of PT education.   Goal status: INITIAL   2.  Pt will score at least 19 pt increase on FOTO to demonstrate functional improvement in MCII and pt perceived function.     Goal status: INITIAL   3.  Pt will be able to demonstrate/report ability to sit/stand/sleep for extended periods of time without pain in order to demonstrate functional improvement and tolerance to static positioning.    Goal status: INITIAL     LONG TERM GOALS: Target date: 07/25/2022     Pt  will become independent with final HEP in order to demonstrate synthesis of PT education.     Goal status: INITIAL   2.  Pt will score >/= 62 on FOTO to demonstrate improvement in perceived lumbar function.    Goal status: INITIAL   3.  Pt will be able to demonstrate  ability to perform stairs without pain  in order to demonstrate functional improvement in LE function for self-care and house hold duties.      Goal status: INITIAL   4.  Pt will be able to demonstrate/report ability to walk >20 mins without pain in order to demonstrate functional improvement and tolerance to exercise and community mobility.    Goal status: INITIAL     PLAN:   PT  FREQUENCY: 1-2x/week   PT DURATION: 12 weeks (likely DC in 8)   PLANNED INTERVENTIONS: Therapeutic exercises, Therapeutic activity, Neuromuscular re-education, Balance training, Gait training, Patient/Family education, Self Care, Joint mobilization, Joint manipulation, Orthotic/Fit training, Aquatic Therapy, Dry Needling, Electrical stimulation, Spinal manipulation, Spinal mobilization, Cryotherapy, Moist heat, scar mobilization, Splintting, Taping, Vasopneumatic device, Traction, Ultrasound, Ionotophoresis 4mg /ml Dexamethasone, Manual therapy, and Re-evaluation.   PLAN FOR NEXT SESSION: R ankle TCJ post glides, STM fibularis group, airex balance, heel raise at steps, revisit footwear   Donnel Saxon Seniya Stoffers, PTA 05/04/2022, 11:30 AM

## 2022-05-15 ENCOUNTER — Encounter (HOSPITAL_BASED_OUTPATIENT_CLINIC_OR_DEPARTMENT_OTHER): Payer: BC Managed Care – PPO | Admitting: Physical Therapy

## 2022-05-19 ENCOUNTER — Encounter (HOSPITAL_BASED_OUTPATIENT_CLINIC_OR_DEPARTMENT_OTHER): Payer: BC Managed Care – PPO | Admitting: Physical Therapy

## 2022-05-23 ENCOUNTER — Encounter (HOSPITAL_BASED_OUTPATIENT_CLINIC_OR_DEPARTMENT_OTHER): Payer: BC Managed Care – PPO

## 2022-05-25 ENCOUNTER — Encounter (HOSPITAL_BASED_OUTPATIENT_CLINIC_OR_DEPARTMENT_OTHER): Payer: BC Managed Care – PPO

## 2022-06-05 NOTE — Telephone Encounter (Signed)
na

## 2022-11-29 ENCOUNTER — Other Ambulatory Visit: Payer: Self-pay

## 2022-11-29 ENCOUNTER — Encounter (HOSPITAL_BASED_OUTPATIENT_CLINIC_OR_DEPARTMENT_OTHER): Payer: Self-pay | Admitting: *Deleted

## 2022-11-29 ENCOUNTER — Emergency Department (HOSPITAL_BASED_OUTPATIENT_CLINIC_OR_DEPARTMENT_OTHER)
Admission: EM | Admit: 2022-11-29 | Discharge: 2022-11-29 | Disposition: A | Discharge status: Home / Self Care | Payer: 59

## 2022-11-29 DIAGNOSIS — K644 Residual hemorrhoidal skin tags: Secondary | ICD-10-CM | POA: Insufficient documentation

## 2022-11-29 MED ORDER — LIDOCAINE (ANORECTAL) 5 % EX CREA: 2.5000 g | TOPICAL_CREAM | Freq: Three times a day (TID) | CUTANEOUS | 0 refills | Status: AC | PRN

## 2022-11-29 NOTE — ED Provider Notes (Signed)
[place] EMERGENCY DEPARTMENT AT [facility] Provider Note   CSN: [id number] Arrival date & time: 11/29/22  0857     History  Chief Complaint  Patient presents with   Hemorrhoids    Haley Vang is a 32 y.o. female.  32 year old female present emergency department for evaluation of hemorrhoids.  Had symptoms last week starting Monday, went to PCP and had drainage done in the office on Thursday.  Reports symptoms never truly left, but worsened yesterday and into this morning.  No fevers chills.  Reports he started her on stool softener, and she has been doing sitz bath's a couple times a day.        Home Medications Prior to Admission medications   Medication Sig Start Date End Date Taking? Authorizing Provider  acetaminophen (TYLENOL) 500 MG tablet Take 1,000 mg by mouth every 6 (six) hours as needed. For pain     [provider]  albuterol (PROVENTIL HFA;VENTOLIN HFA) 108 (90 Base) MCG/ACT inhaler Inhale 2 puffs into the lungs every 6 (six) hours as needed for wheezing or shortness of breath.    [provider]  ARIPiprazole (ABILIFY) 2 MG tablet Take 2 mg by mouth at bedtime.     [provider]  cyclobenzaprine (FLEXERIL) 10 MG tablet Take 10 mg by mouth 3 (three) times daily as needed for muscle spasms.    [provider]  FLUoxetine (PROZAC) 10 MG capsule Take 10 mg by mouth at bedtime.     [provider]  fluticasone (FLONASE) 50 MCG/ACT nasal spray Place 1 spray into both nostrils daily as needed for allergies or rhinitis.    [provider]  Prenatal Vit-Fe Fumarate-FA (MULTIVITAMIN-PRENATAL) 27-0.8 MG TABS tablet Take 1 tablet by mouth daily at 12 noon.    [provider]      Allergies    Sprintec 28 [norgestimate-eth estradiol]    Review of Systems   Review of Systems  Physical Exam Updated Vital Signs BP (!) 140/96 (BP Location: Right Arm)   Pulse (!) 102   Temp 98 F (36.7 C) (Oral)    Resp 16   Wt 127 kg   LMP 11/15/2022 (Approximate)   SpO2 100%   BMI 46.59 kg/m  Physical Exam Vitals and nursing note reviewed. Exam conducted with a chaperone present.  Constitutional:      General: She is not in acute distress. HENT:     Head: Normocephalic.  Eyes:     Conjunctiva/sclera: Conjunctivae normal.  Cardiovascular:     Rate and Rhythm: Normal rate and regular rhythm.  Pulmonary:     Effort: Pulmonary effort is normal.     Breath sounds: Normal breath sounds.  Abdominal:     General: There is no distension.     Tenderness: There is no abdominal tenderness. There is no guarding or rebound.  Genitourinary:    Comments: Small external hemorrhoid with healed incision.  No erythema, fluctuance or drainage in the perineum. Musculoskeletal:        General: Normal range of motion.  Skin:    General: Skin is warm.     Capillary Refill: Capillary refill takes less than 2 seconds.  Neurological:     Mental Status: She is alert and oriented to person, place, and time.  Psychiatric:        Mood and Affect: Mood normal.        Behavior: Behavior normal.     ED Results / Procedures / Treatments  Labs (all labs ordered are listed, but only abnormal results are displayed) Labs Reviewed - No data to display  EKG None  Radiology No results found.  Procedures Procedures    Medications Ordered in ED Medications - No data to display  ED Course/ Medical Decision Making/ A&P                                 Medical Decision Making Well-appearing 32 year old female presenting emergency department for evaluation of her hemorrhoids.  Vital signs reassuring.  Physical exam with small external hemorrhoid.  Does not appear to have infectious process/Fournier's gangrene.  Given patient with recent incision and drainage outpatient with continued symptoms greater than 72 hours we will forego incision and drainage.  Discussed supportive care.  Will discharge with PCP  follow-up.  Amount and/or Complexity of Data Reviewed Labs:     Details: Consider labs, however low suspicion for acute infectious process. Radiology:     Details: Considered CT scan, however history and exam consistent with external hemorrhoid.  CT scan unlikely to reveal acute intra-abdominal/pelvic pathology at this time.  Low suspicion for perianal abscess.          Final Clinical Impression(s) / ED Diagnoses Final diagnoses:  None    Rx / DC Orders ED Discharge Orders     None         Coral Spikes, DO 11/29/22 1610

## 2022-11-29 NOTE — Discharge Instructions (Signed)
Continue take the stool softener you are prescribed.  May also try taking fiber supplements.  Continue to use sitz bath's frequently throughout the day.  We are prescribing you topical lidocaine which will help with your pain.  Follow-up with your primary doctor.  Return immediately felt fevers, chills, redness, drainage to the area or any new or worsening symptoms that are concerning to you.

## 2022-11-29 NOTE — ED Triage Notes (Signed)
Pt is here for evaluation of thrombosed external hemorrhoid.  She reports that her PCP was able to remove 3 clots the size of the tip of her pinky finger out of it last week and she reports that it is back to being as painful as it was before she saw PCP last week.

## 2023-04-12 ENCOUNTER — Emergency Department (HOSPITAL_COMMUNITY)
Admission: EM | Admit: 2023-04-12 | Discharge: 2023-04-12 | Disposition: A | Discharge status: Home / Self Care | Payer: 59 | Attending: Emergency Medicine | Admitting: Emergency Medicine

## 2023-04-12 ENCOUNTER — Other Ambulatory Visit: Payer: Self-pay

## 2023-04-12 ENCOUNTER — Emergency Department (HOSPITAL_COMMUNITY): Payer: 59

## 2023-04-12 ENCOUNTER — Encounter (HOSPITAL_COMMUNITY): Payer: Self-pay

## 2023-04-12 DIAGNOSIS — F419 Anxiety disorder, unspecified: Secondary | ICD-10-CM | POA: Insufficient documentation

## 2023-04-12 DIAGNOSIS — J45909 Unspecified asthma, uncomplicated: Secondary | ICD-10-CM | POA: Insufficient documentation

## 2023-04-12 DIAGNOSIS — N132 Hydronephrosis with renal and ureteral calculous obstruction: Secondary | ICD-10-CM | POA: Diagnosis not present

## 2023-04-12 DIAGNOSIS — R109 Unspecified abdominal pain: Secondary | ICD-10-CM | POA: Diagnosis present

## 2023-04-12 DIAGNOSIS — N200 Calculus of kidney: Secondary | ICD-10-CM | POA: Insufficient documentation

## 2023-04-12 LAB — URINALYSIS, ROUTINE W REFLEX MICROSCOPIC
Bilirubin Urine: NEGATIVE
Glucose, UA: NEGATIVE mg/dL
Ketones, ur: NEGATIVE mg/dL
Nitrite: NEGATIVE
Protein, ur: NEGATIVE mg/dL
Specific Gravity, Urine: 1.014 (ref 1.005–1.030)
pH: 5 (ref 5.0–8.0)

## 2023-04-12 LAB — PREGNANCY, URINE: Preg Test, Ur: NEGATIVE

## 2023-04-12 MED ORDER — TAMSULOSIN HCL 0.4 MG PO CAPS: 0.4000 mg | ORAL_CAPSULE | Freq: Every day | ORAL | 0 refills | Status: AC

## 2023-04-12 MED ORDER — KETOROLAC TROMETHAMINE 30 MG/ML IJ SOLN
30.0000 mg | Freq: Once | INTRAMUSCULAR | Status: AC
  Administered 2023-04-12: 30 mg via INTRAMUSCULAR
  Filled 2023-04-12: qty 1

## 2023-04-12 MED ORDER — HYDROCODONE-ACETAMINOPHEN 5-325 MG PO TABS: 1.0000 | ORAL_TABLET | Freq: Four times a day (QID) | ORAL | 0 refills | Status: AC | PRN

## 2023-04-12 NOTE — ED Provider Notes (Signed)
 [place] [facility] AT [facility] Provider Note   CSN: [id number] Arrival date & time: 04/12/23  0141     History  Chief Complaint  Patient presents with   Urinary Retention    Pt endorses urinary rentention for about 6-7 days.  States that she urinates only a little bit and has not been able to empty bladder. Endorses back pain, and cloudy urine and nausea. Seen by primary and dx with UTI but symptoms have not gotten better.   Flank Pain    Haley Vang is a 33 y.o. female.  The history is provided by the patient.  Flank Pain Pertinent negatives include no chest pain.  Patient presents with flank pain and urinary symptoms.  Patient reports around a week ago she started having the feeling that her bladder was full but was only passing minimal amount of urine.  No hematuria.  She reports taking in a lot of fluids with very minimal urine output.  She was seen by her PCP who prescribed an antibiotic for presumed UTI, but the patient refused to take it.  Starting tonight she started having left flank pain that is new and very severe.  It does not radiate into her abdomen.  No previous history of kidney stones    Past Medical History:  Diagnosis Date   Anxiety    Asthma    last used inhaler 1.5 months ago   Depression    Headache     Home Medications Prior to Admission medications   Medication Sig Start Date End Date Taking? Authorizing Provider  HYDROcodone-acetaminophen (NORCO/VICODIN) 5-325 MG tablet Take 1 tablet by mouth every 6 (six) hours as needed for severe pain (pain score 7-10). 04/12/23  Yes Zadie Rhine, MD  tamsulosin (FLOMAX) 0.4 MG CAPS capsule Take 1 capsule (0.4 mg total) by mouth daily after supper. 04/12/23  Yes Zadie Rhine, MD  acetaminophen (TYLENOL) 500 MG tablet Take 1,000 mg by mouth every 6 (six) hours as needed. For pain     [provider]  albuterol (PROVENTIL HFA;VENTOLIN HFA) 108 (90 Base) MCG/ACT inhaler Inhale  2 puffs into the lungs every 6 (six) hours as needed for wheezing or shortness of breath.    [provider]  ARIPiprazole (ABILIFY) 2 MG tablet Take 2 mg by mouth at bedtime.     [provider]  cyclobenzaprine (FLEXERIL) 10 MG tablet Take 10 mg by mouth 3 (three) times daily as needed for muscle spasms.    [provider]  FLUoxetine (PROZAC) 10 MG capsule Take 10 mg by mouth at bedtime.     [provider]  fluticasone (FLONASE) 50 MCG/ACT nasal spray Place 1 spray into both nostrils daily as needed for allergies or rhinitis.    [provider]  Lidocaine, Anorectal, 5 % CREA Apply 2.5 g topically 3 (three) times daily as needed. Max 20g per day. 11/29/22   Coral Spikes, DO  Prenatal Vit-Fe Fumarate-FA (MULTIVITAMIN-PRENATAL) 27-0.8 MG TABS tablet Take 1 tablet by mouth daily at 12 noon.    [provider]      Allergies    Sprintec 28 [norgestimate-eth estradiol]    Review of Systems   Review of Systems  Constitutional:  Negative for fever.  Cardiovascular:  Negative for chest pain.  Gastrointestinal:  Negative for vomiting.  Genitourinary:  Positive for decreased urine volume, difficulty urinating, flank pain and urgency. Negative for hematuria, vaginal bleeding and vaginal discharge.  Psychiatric/Behavioral:  The patient is  nervous/anxious.     Physical Exam Updated Vital Signs BP (!) 143/93   Pulse (!) 103   Temp 98.3 F (36.8 C)   Resp 20   SpO2 100%  Physical Exam CONSTITUTIONAL: Well developed/well nourished HEAD: Normocephalic/atraumatic ENMT: Mucous membranes moist NECK: supple no meningeal signs SPINE/BACK:entire spine nontender CV: S1/S2 noted, no murmurs/rubs/gallops noted LUNGS: Lungs are clear to auscultation bilaterally, no apparent distress ABDOMEN: soft, nontender, no rebound or guarding, bowel sounds noted throughout abdomen No suprapubic tenderness ZO:XWRU cva tenderness NEURO: Pt is  awake/alert/appropriate, moves all extremitiesx4.  No facial droop.   EXTREMITIES: pulses normal/equal, full ROM SKIN: warm, color normal PSYCH: Anxious  ED Results / Procedures / Treatments   Labs (all labs ordered are listed, but only abnormal results are displayed) Labs Reviewed  URINALYSIS, ROUTINE W REFLEX MICROSCOPIC - Abnormal; Notable for the following components:      Result Value   Hgb urine dipstick MODERATE (*)    Leukocytes,Ua TRACE (*)    Bacteria, UA RARE (*)    All other components within normal limits  URINE CULTURE  PREGNANCY, URINE    EKG EKG Interpretation Date/Time:  Thursday April 12 2023 02:01:14 EST Ventricular Rate:  96 PR Interval:  141 QRS Duration:  79 QT Interval:  365 QTC Calculation: 462 R Axis:   66  Text Interpretation: Sinus rhythm Confirmed by Zadie Rhine ([id number]) on 04/12/2023 2:05:44 AM  Radiology CT Renal Stone Study Result Date: 04/12/2023 CLINICAL DATA:  33 year old female with urinary retention, abdomen, flank, back pain. Cloudy urine and nausea. EXAM: CT ABDOMEN AND PELVIS WITHOUT CONTRAST TECHNIQUE: Multidetector CT imaging of the abdomen and pelvis was performed following the standard protocol without IV contrast. RADIATION DOSE REDUCTION: This exam was performed according to the departmental dose-optimization program which includes automated exposure control, adjustment of the mA and/or kV according to patient size and/or use of iterative reconstruction technique. COMPARISON:  CT Abdomen and Pelvis 03/06/2017. FINDINGS: Lower chest: Small chronic right lower lobe lung nodule measuring 3-4 mm on series 4, image 24 was present in 2019, benign (no follow-up imaging recommended). Otherwise negative lung bases. Normal heart size. No pericardial or pleural effusion. Hepatobiliary: Negative noncontrast liver and gallbladder. Pancreas: Negative noncontrast appearance. Spleen: Stable at the upper limits of normal. Incidental splenules (normal  variant). Adrenals/Urinary Tract: Normal adrenal glands. Nonobstructed, negative noncontrast right kidney. New left hydronephrosis since 2019, moderate. Left nephromegaly. No intrarenal calculus identified on the left side. Left hydroureter. The left ureter remains tortuous into the pelvis. And at the level of the left ureterovesical junction there is an oblong 7 x 3 mm calculus (series 2, image 75), new since 2019. Superimposed bilateral pelvic phleboliths, most of which were present previously. Mildly to moderately distended urinary bladder otherwise appears negative. No other urinary calculus identified. Stomach/Bowel: Redundant large bowel, especially the junction of the descending and sigmoid colon, flexures. Otherwise negative colon. Normal retrocecal appendix on series 2, image 60. Nondilated small bowel. Decompressed stomach and duodenum. No free air or free fluid identified. Vascular/Lymphatic: Normal caliber abdominal aorta. No calcified atherosclerosis or lymphadenopathy identified. Reproductive: Retroverted uterus, otherwise negative noncontrast appearance. Other: No pelvis free fluid. Musculoskeletal: Lumbosacral junction facet degeneration. No acute osseous abnormality identified. IMPRESSION: 1. Acute obstructive uropathy on the Left with moderate hydronephrosis and hydroureter due to an oblong 7 x 3 mm obstructing calculus at the Left UVJ. No other urinary calculus identified. 2. No other acute or inflammatory process identified in the noncontrast abdomen or pelvis. Electronically  Signed   By: Odessa Fleming M.D.   On: 04/12/2023 04:18    Procedures Procedures    Medications Ordered in ED Medications  ketorolac (TORADOL) 30 MG/ML injection 30 mg (30 mg Intramuscular Given 04/12/23 0258)    ED Course/ Medical Decision Making/ A&P Clinical Course as of 04/12/23 0441  Thu Apr 12, 2023  0415 Patient improved, awaiting CT results [DW]  0439 Patient is now feeling significantly better.  She is in no  acute distress, no vomiting.  No signs of UTI, she is afebrile and not septic appearing.  CT imaging reveals large ureteral stone with hydronephrosis.  Since CT imaging patient has been able to pass urine. [DW]  [phone] Patient reports feeling well enough to go home and declines any further workup or labs.  Patient has been given referral to see urology within the next week as she was instructed that she may need to have a urologic procedure because the stone may not pass spontaneously.  Will prescribe Flomax for medical expulsive therapy [DW]  0440 We discussed strict ER return precautions [DW]    Clinical Course User Index [DW] Zadie Rhine, MD                                 Medical Decision Making Amount and/or Complexity of Data Reviewed Labs: ordered. Radiology: ordered.  Risk Prescription drug management.   This patient presents to the ED for concern of flank pain, this involves an extensive number of treatment options, and is a complaint that carries with it a high risk of complications and morbidity.  The differential diagnosis includes but is not limited to UTI, pyelonephritis, ureteral stone, muscle strain, ectopic pregnancy, tubo-ovarian abscess, ovarian torsion  Comorbidities that complicate the patient evaluation: Patient's presentation is complicated by their history of obesity   Lab Tests: I Ordered, and personally interpreted labs.  The pertinent results include: Hematuria, no signs of UTI  Imaging Studies ordered: I ordered imaging studies including CT scan renal   I independently visualized and interpreted imaging which showed ureteral stone noted I agree with the radiologist interpretation   Medicines ordered and prescription drug management: I ordered medication including Toradol for pain Reevaluation of the patient after these medicines showed that the patient    improved   Reevaluation: After the interventions noted above, I reevaluated the patient and  found that they have :improved  Complexity of problems addressed: Patient's presentation is most consistent with  acute presentation with potential threat to life or bodily function  Disposition: After consideration of the diagnostic results and the patient's response to treatment,  I feel that the patent would benefit from discharge   .           Final Clinical Impression(s) / ED Diagnoses Final diagnoses:  Ureteral stone with hydronephrosis    Rx / DC Orders ED Discharge Orders          Ordered    tamsulosin (FLOMAX) 0.4 MG CAPS capsule  Daily after supper        04/12/23 0431    HYDROcodone-acetaminophen (NORCO/VICODIN) 5-325 MG tablet  Every 6 hours PRN        04/12/23 0438              Zadie Rhine, MD 04/12/23 [phone]

## 2023-04-12 NOTE — ED Triage Notes (Signed)
 Pt endorses urinary rentention for about 6-7 days.  States that she urinates only a little bit and has not been able to empty bladder. Endorses back pain, and cloudy urine and nausea. Seen by primary and dx with UTI but symptoms have not gotten better.

## 2023-04-12 NOTE — Progress Notes (Deleted)
 Name: Haley Vang DOB: November 27, 1990 MRN: [id number]  History of Present Illness: Ms. Bogle is a 33 y.o. female who presents today as a new patient at [facility] [place]. All available relevant medical records have been reviewed.  ***She is accompanied by ***.  She reports concern of kidney stone(s).  She denies prior history of kidney stones.  Recent history: > 04/12/2023: Seen in ER for left flank pain and decreased urine output. UA showed 0-5 WBC/hpf, 6-10 RBC/hpf, rare bacteria. CT showed an oblong 7 x 3 mm left UVJ stone with resultant moderate hydroureteronephrosis.  She was discharged with prescriptions for Flomax and Norco 5-325 mg (#*** dispensed *** per PMP aware controlled substance registry).  Today: She {Actions; denies-reports:120008} flank pain or abdominal pain. She {Actions; denies-reports:120008} fevers, nausea, or vomiting.  She {Actions; denies-reports:120008} increased urinary urgency, frequency, nocturia, dysuria, gross hematuria, hesitancy, straining to void, or sensations of incomplete emptying.   Fall Screening: Do you usually have a device to assist in your mobility? {yes/no:20286} ***cane / ***walker / ***wheelchair  Medications: Current Outpatient Medications  Medication Sig Dispense Refill   acetaminophen (TYLENOL) 500 MG tablet Take 1,000 mg by mouth every 6 (six) hours as needed. For pain      albuterol (PROVENTIL HFA;VENTOLIN HFA) 108 (90 Base) MCG/ACT inhaler Inhale 2 puffs into the lungs every 6 (six) hours as needed for wheezing or shortness of breath.     ARIPiprazole (ABILIFY) 2 MG tablet Take 2 mg by mouth at bedtime.      cyclobenzaprine (FLEXERIL) 10 MG tablet Take 10 mg by mouth 3 (three) times daily as needed for muscle spasms.     FLUoxetine (PROZAC) 10 MG capsule Take 10 mg by mouth at bedtime.      fluticasone (FLONASE) 50 MCG/ACT nasal spray Place 1 spray into both nostrils daily as needed for allergies or rhinitis.      HYDROcodone-acetaminophen (NORCO/VICODIN) 5-325 MG tablet Take 1 tablet by mouth every 6 (six) hours as needed for severe pain (pain score 7-10). 10 tablet 0   Lidocaine, Anorectal, 5 % CREA Apply 2.5 g topically 3 (three) times daily as needed. Max 20g per day. 38 g 0   Prenatal Vit-Fe Fumarate-FA (MULTIVITAMIN-PRENATAL) 27-0.8 MG TABS tablet Take 1 tablet by mouth daily at 12 noon.     tamsulosin (FLOMAX) 0.4 MG CAPS capsule Take 1 capsule (0.4 mg total) by mouth daily after supper. 14 capsule 0   No current facility-administered medications for this visit.   Facility-Administered Medications Ordered in Other Visits  Medication Dose Route Frequency Provider Last Rate Last Admin   lactated ringers infusion   Intravenous Continuous PRN Renford Dills, CRNA   New Bag at 09/20/16 1850    Allergies: Allergies  Allergen Reactions   Sprintec 28 [Norgestimate-Eth Estradiol] Rash    Past Medical History:  Diagnosis Date   Anxiety    Asthma    last used inhaler 1.5 months ago   Depression    Headache    Past Surgical History:  Procedure Laterality Date   WISDOM TOOTH EXTRACTION     Family History  Problem Relation Age of Onset   Hypertension Mother    Thyroid disease Mother    Depression Father    Hypertension Father    Stroke Paternal Grandmother    Social History   Socioeconomic History   Marital status: Married    Spouse name: Not on file   Number of children: Not on file   Years  of education: Not on file   Highest education level: Not on file  Occupational History   Occupation: Consulting civil engineer at [employer] in [place]  Tobacco Use   Smoking status: Never   Smokeless tobacco: Never  Vaping Use   Vaping status: Never Used  Substance and Sexual Activity   Alcohol use: No   Drug use: No   Sexual activity: Yes  Other Topics Concern   Not on file  Social History Narrative   Not on file   Social Drivers of Health   Financial Resource Strain: Low Risk   (02/14/2018)   Overall Financial Resource Strain (CARDIA)    Difficulty of Paying Living Expenses: Not hard at all  Food Insecurity: No Food Insecurity (02/14/2018)   Hunger Vital Sign    Worried About Running Out of Food in the Last Year: Never true    Ran Out of Food in the Last Year: Never true  Transportation Needs: Unknown (02/14/2018)   PRAPARE - Administrator, Civil Service (Medical): No    Lack of Transportation (Non-Medical): Not on file  Physical Activity: Not on file  Stress: No Stress Concern Present (02/14/2018)   [employer] of Occupational Health - Occupational Stress Questionnaire    Feeling of Stress : Not at all  Social Connections: Unknown (06/20/2021)   Received from [facility]   Social Network    Social Network: Not on file  Intimate Partner Violence: Unknown (05/31/2021)   Received from [facility]   HITS    Physically Hurt: Not on file    Insult or Talk Down To: Not on file    Threaten Physical Harm: Not on file    Scream or Curse: Not on file    SUBJECTIVE  Review of Systems Constitutional: Patient denies any unintentional weight loss or change in strength lntegumentary: Patient denies any rashes or pruritus Cardiovascular: Patient denies chest pain or syncope Respiratory: Patient denies shortness of breath Gastrointestinal: Patient ***denies nausea, vomiting, constipation, or diarrhea Musculoskeletal: Patient denies muscle cramps or weakness Neurologic: Patient denies convulsions or seizures Allergic/Immunologic: Patient denies recent allergic reaction(s) Hematologic/Lymphatic: Patient denies bleeding tendencies Endocrine: Patient denies heat/cold intolerance  GU: As per HPI.  OBJECTIVE There were no vitals filed for this visit. There is no height or weight on file to calculate BMI.  Physical Examination Constitutional: No obvious distress; patient is non-toxic appearing  Cardiovascular: No visible  lower extremity edema.  Respiratory: The patient does not have audible wheezing/stridor; respirations do not appear labored  Gastrointestinal: Abdomen non-distended Musculoskeletal: Normal ROM of UEs  Skin: No obvious rashes/open sores  Neurologic: CN 2-12 grossly intact Psychiatric: Answered questions appropriately with normal affect  Hematologic/Lymphatic/Immunologic: No obvious bruises or sites of spontaneous bleeding  UA:  ***positive for *** leukocytes, *** blood, ***nitrites ***Urine microscopy:  ***negative  *** WBC/hpf, *** RBC/hpf, *** bacteria ***with no evidence of UTI ***with no evidence of microscopic hematuria ***otherwise unremarkable ***glucosuria (secondary to ***Jardiance ***Farxiga use)  PVR: *** ml  ASSESSMENT No diagnosis found.  ***We reviewed recent imaging results; ***awaiting radiology results, appears to have ***no acute findings per provider interpretation.  ***Advised adequate hydration and we discussed option to consider low oxalate diet given that calcium oxalate is the most common type of stone. Handout provided about stone prevention diet.  For acute GU stone symptoms we agreed to proceed with: - ***RUS and KUB / ***CT stone study to evaluate current stone burden.  - ***CMP to  assess kidney function. - ***Flomax 0.4 mg daily for medical expulsive therapy (MET), which may improve passage of stone(s).  - For pain management, we discussed the use of opioids versus OTC analgesics. A 5 day prescription was sent for ***Percocet for PRN use for severe pain.  - For nausea / vomiting, a prescription was sent for ***Zofran / ***Phenergan for PRN use.  ***We discussed the various treatment options including ***medical expulsive therapy (MET), ***extracorporeal shock wave lithotripsy (ESWL), ureteroscopic stone manipulation (URS), or ***percutaneous nephrolithotomy (PCNL). We discussed possible risks and benefits of intervention including but not limited to:  including pain, infection, sepsis, UTI, ureter perforation, need for stenting, post-op ureteral stricture, hematuria.  ***Will consult with ***Dr. Ronne Binning and notify patient of his recommendations ***for next steps / ***following review of imaging results.  Will plan to follow up in ***2 weeks ***6 months with ***KUB ***RUS for stone surveillance or sooner if needed.   She was advised to contact urology provider or go to the ER if She develops fever >101F, uncontrollable pain, or other significantly concerning symptoms prior to next office visit.  She verbalized understanding and agreement. All questions were answered.   PLAN Advised the following: ***Flomax daily x2 weeks. ***Analgesics PRN for pain. ***Zofran PRN for nausea. ***No follow-ups on file.  No orders of the defined types were placed in this encounter.   It has been explained that the patient is to follow regularly with their PCP in addition to all other providers involved in their care and to follow instructions provided by these respective offices. Patient advised to contact urology clinic if any urologic-pertaining questions, concerns, new symptoms or problems arise in the interim period.  There are no Patient Instructions on file for this visit.  Electronically signed by: Donnita Falls, MSN, FNP-C, CUNP 04/12/2023 8:31 AM

## 2023-04-12 NOTE — ED Notes (Signed)
 14 ml recorded via US Bladder scan

## 2023-04-13 ENCOUNTER — Encounter: Payer: Self-pay | Admitting: Urology

## 2023-04-13 ENCOUNTER — Other Ambulatory Visit: Payer: Self-pay

## 2023-04-13 ENCOUNTER — Ambulatory Visit: Payer: 59 | Admitting: Urology

## 2023-04-13 VITALS — BP 136/90 | HR 101 | Temp 98.6°F

## 2023-04-13 DIAGNOSIS — R109 Unspecified abdominal pain: Secondary | ICD-10-CM

## 2023-04-13 DIAGNOSIS — R1032 Left lower quadrant pain: Secondary | ICD-10-CM

## 2023-04-13 DIAGNOSIS — R399 Unspecified symptoms and signs involving the genitourinary system: Secondary | ICD-10-CM

## 2023-04-13 DIAGNOSIS — R8290 Unspecified abnormal findings in urine: Secondary | ICD-10-CM

## 2023-04-13 DIAGNOSIS — N201 Calculus of ureter: Secondary | ICD-10-CM

## 2023-04-13 DIAGNOSIS — N200 Calculus of kidney: Secondary | ICD-10-CM

## 2023-04-13 DIAGNOSIS — R112 Nausea with vomiting, unspecified: Secondary | ICD-10-CM

## 2023-04-13 LAB — URINALYSIS, ROUTINE W REFLEX MICROSCOPIC
Bilirubin, UA: NEGATIVE
Glucose, UA: NEGATIVE
Ketones, UA: NEGATIVE
Nitrite, UA: NEGATIVE
Specific Gravity, UA: 1.03 (ref 1.005–1.030)
Urobilinogen, Ur: 0.2 mg/dL (ref 0.2–1.0)
pH, UA: 6 (ref 5.0–7.5)

## 2023-04-13 LAB — URINE CULTURE: Culture: NO GROWTH

## 2023-04-13 LAB — MICROSCOPIC EXAMINATION: RBC, Urine: >30 /[HPF] — AB (ref 0–2)

## 2023-04-13 MED ORDER — KETOROLAC TROMETHAMINE 10 MG PO TABS
10.0000 mg | ORAL_TABLET | Freq: Four times a day (QID) | ORAL | 0 refills | Status: AC | PRN
Start: 2023-04-13 — End: ?

## 2023-04-13 MED ORDER — ONDANSETRON 8 MG PO TBDP: 8.0000 mg | ORAL_TABLET | Freq: Three times a day (TID) | ORAL | 1 refills | Status: AC | PRN

## 2023-04-13 MED ORDER — KETOROLAC TROMETHAMINE 60 MG/2ML IM SOLN
60.0000 mg | Freq: Once | INTRAMUSCULAR | Status: AC
  Administered 2023-04-13: 60 mg via INTRAMUSCULAR

## 2023-04-13 NOTE — Progress Notes (Signed)
 Patient receiving Toradol injection per NP order  The injection site was cleaned and prepped with alcohol. A band aid applied after injection given.    Medication: Toradol Dose: 60 mg Location: left  Lot: [id number] Exp: 09/2023  Patient tolerated well, no complications were noted  Performed by: Guss Bunde, CMA

## 2023-04-13 NOTE — H&P (View-Only) (Signed)
 Name: Haley Vang DOB: 11/27/1990 MRN: [id number]  History of Present Illness: Ms. Haley Vang is a 33 y.o. female who presents today as a new patient at [facility] . All available relevant medical records have been reviewed.   She reports concern of kidney stone(s).  She denies prior history of kidney stones.  Recent history: > 04/12/2023: Seen in ER for left flank pain and decreased urine output. UA showed 0-5 WBC/hpf, 6-10 RBC/hpf, rare bacteria. CT showed an oblong 7 x 3 mm left UVJ stone with resultant moderate hydroureteronephrosis.  She was discharged with prescriptions for Flomax and Norco 5-325 mg.  Today: She denies flank pain but reports LLQ abdominal pain. Started having nausea and vomiting this morning. Denies fevers. She reports increased urinary urgency and frequency. Saw a small amount of blood in her urine this morning. Mild dysuria.    Fall Screening: Do you usually have a device to assist in your mobility? No   Medications: Current Outpatient Medications  Medication Sig Dispense Refill   acetaminophen (TYLENOL) 500 MG tablet Take 1,000 mg by mouth every 6 (six) hours as needed. For pain      albuterol (PROVENTIL HFA;VENTOLIN HFA) 108 (90 Base) MCG/ACT inhaler Inhale 2 puffs into the lungs every 6 (six) hours as needed for wheezing or shortness of breath.     fluticasone (FLONASE) 50 MCG/ACT nasal spray Place 1 spray into both nostrils daily as needed for allergies or rhinitis.     HYDROcodone-acetaminophen (NORCO/VICODIN) 5-325 MG tablet Take 1 tablet by mouth every 6 (six) hours as needed for severe pain (pain score 7-10). 10 tablet 0   ketorolac (TORADOL) 10 MG tablet Take 1 tablet (10 mg total) by mouth every 6 (six) hours as needed. 30 tablet 0   Lidocaine, Anorectal, 5 % CREA Apply 2.5 g topically 3 (three) times daily as needed. Max 20g per day. 38 g 0   ondansetron (ZOFRAN-ODT) 8 MG disintegrating tablet Take 1 tablet (8 mg total) by mouth  every 8 (eight) hours as needed for nausea or vomiting. 30 tablet 1   tamsulosin (FLOMAX) 0.4 MG CAPS capsule Take 1 capsule (0.4 mg total) by mouth daily after supper. 14 capsule 0   ARIPiprazole (ABILIFY) 2 MG tablet Take 2 mg by mouth at bedtime.      cyclobenzaprine (FLEXERIL) 10 MG tablet Take 10 mg by mouth 3 (three) times daily as needed for muscle spasms.     Prenatal Vit-Fe Fumarate-FA (MULTIVITAMIN-PRENATAL) 27-0.8 MG TABS tablet Take 1 tablet by mouth daily at 12 noon.     No current facility-administered medications for this visit.   Facility-Administered Medications Ordered in Other Visits  Medication Dose Route Frequency Provider Last Rate Last Admin   lactated ringers infusion   Intravenous Continuous PRN Renford Dills, CRNA   New Bag at 09/20/16 1850    Allergies: Allergies  Allergen Reactions   Sprintec 28 [Norgestimate-Eth Estradiol] Rash    Past Medical History:  Diagnosis Date   Anxiety    Asthma    last used inhaler 1.5 months ago   Depression    Headache    Past Surgical History:  Procedure Laterality Date   WISDOM TOOTH EXTRACTION     Family History  Problem Relation Age of Onset   Hypertension Mother    Thyroid disease Mother    Depression Father    Hypertension Father    Stroke Paternal Grandmother    Social History   Socioeconomic History   Marital  status: Married    Spouse name: Not on file   Number of children: Not on file   Years of education: Not on file   Highest education level: Not on file  Occupational History   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  Tobacco Use   Smoking status: Never   Smokeless tobacco: Never  Vaping Use   Vaping status: Never Used  Substance and Sexual Activity   Alcohol use: No   Drug use: No   Sexual activity: Yes  Other Topics Concern   Not on file  Social History Narrative   Not on file   Social Drivers of Health   Financial Resource Strain: Low Risk  (04/12/2023)   Received  from [facility]   Overall Financial Resource Strain (CARDIA)    Difficulty of Paying Living Expenses: Not hard at all  Food Insecurity: No Food Insecurity (04/12/2023)   Received from [facility]   Hunger Vital Sign    Worried About Running Out of Food in the Last Year: Never true    Ran Out of Food in the Last Year: Never true  Transportation Needs: No Transportation Needs (04/12/2023)   Received from [facility] - Transportation    Lack of Transportation (Medical): No    Lack of Transportation (Non-Medical): No  Physical Activity: Insufficiently Active (04/12/2023)   Received from [facility]   Exercise Vital Sign    Days of Exercise per Week: 4 days    Minutes of Exercise per Session: 30 min  Stress: No Stress Concern Present (04/12/2023)   Received from [facility] - Occupational Stress Questionnaire    Feeling of Stress : Not at all  Social Connections: Socially Integrated (04/12/2023)   Received from [facility]   Social Network    How would you rate your social network (family, work, friends)?: Good participation with social networks  Intimate Partner Violence: Not At Risk (04/12/2023)   Received from [facility]   HITS    Over the last 12 months how often did your partner physically hurt you?: Never    Over the last 12 months how often did your partner insult you or talk down to you?: Never    Over the last 12 months how often did your partner threaten you with physical harm?: Never    Over the last 12 months how often did your partner scream or curse at you?: Never    SUBJECTIVE  Review of Systems Constitutional: Patient denies any unintentional weight loss or change in strength lntegumentary: Patient denies any rashes or pruritus Cardiovascular: Patient denies chest pain or syncope Respiratory: Patient denies shortness of breath Gastrointestinal: Per HPI  Musculoskeletal: Patient denies muscle cramps or  weakness Neurologic: Patient denies convulsions or seizures Allergic/Immunologic: Patient denies recent allergic reaction(s) Hematologic/Lymphatic: Patient denies bleeding tendencies Endocrine: Patient denies heat/cold intolerance  GU: As per HPI.  OBJECTIVE Vitals:   04/13/23 0917  BP: (!) 136/90  Pulse: (!) 101  Temp: 98.6 F (37 C)   There is no height or weight on file to calculate BMI.  Physical Examination Constitutional: Patient appears uncomfortable Cardiovascular: No visible lower extremity edema.  Respiratory: The patient does not have audible wheezing/stridor; respirations do not appear labored  Gastrointestinal: Abdomen non-distended; heaving at times during visit Musculoskeletal: Normal ROM of UEs  Skin: No obvious rashes/open sores  Neurologic: CN 2-12 grossly intact Psychiatric: Answered questions appropriately with normal affect  Hematologic/Lymphatic/Immunologic:  No obvious bruises or sites of spontaneous bleeding  Urine microscopy: 6-10 WBC/hpf, >30 RBC/hpf, few bacteria  ASSESSMENT Kidney stones - Plan: Urinalysis, Routine w reflex microscopic, ondansetron (ZOFRAN-ODT) 8 MG disintegrating tablet, ketorolac (TORADOL) 10 MG tablet, Urine culture, Ambulatory Referral For Surgery Scheduling, ketorolac (TORADOL) injection 60 mg  Left ureteral stone - Plan: Urinalysis, Routine w reflex microscopic, ketorolac (TORADOL) 10 MG tablet, Urine culture, Ambulatory Referral For Surgery Scheduling  LLQ pain - Plan: ketorolac (TORADOL) 10 MG tablet, Urine culture, Ambulatory Referral For Surgery Scheduling  Lower urinary tract symptoms (LUTS) - Plan: ketorolac (TORADOL) 10 MG tablet, Urine culture, Ambulatory Referral For Surgery Scheduling  Nausea and vomiting, unspecified vomiting type - Plan: ondansetron (ZOFRAN-ODT) 8 MG disintegrating tablet, ketorolac (TORADOL) 10 MG tablet, Urine culture, Ambulatory Referral For Surgery Scheduling  Abnormal urinalysis - Plan: Urine  culture, Ambulatory Referral For Surgery Scheduling  We reviewed recent history and findings. She is acutely uncomfortable in office today so Toradol 60 mg IM administered. Dr. Ronne Binning was consulted; will plan to proceed with ESWL next week on Tuesday 04/17/2023. Patient was advised of how that procedure works and possible risks and benefits.  Advised to continue Flomax 0.4 mg daily in the interim. Toradol 10 mg PO ordered for pain management and she can take the Norco 5-325 mg prescribed in the ER as needed for severe breakthrough pain. For nausea / vomiting, a prescription was sent for Zofran 8 mg sublingual PRN.  Abnormal UA. Will check urine culture and treat as indicated based on results.  She was advised to contact urology provider or go to the ER if She develops fever >101F, uncontrollable pain, or other significantly concerning symptoms prior to next office visit.  She verbalized understanding and agreement. All questions were answered.   PLAN Advised the following: Urine culture. Continue Flomax (Tamsulosin) 0.4 mg daily. Analgesics PRN for pain. Zofran PRN for nausea. Return for surgery.  Orders Placed This Encounter  Procedures   Urine culture   Urinalysis, Routine w reflex microscopic   Ambulatory Referral For Surgery Scheduling    Referral Priority:   Routine    Referral Type:   Consultation    Referred to Provider:   Malen Gauze, MD    Number of Visits Requested:   1    It has been explained that the patient is to follow regularly with their PCP in addition to all other providers involved in their care and to follow instructions provided by these respective offices. Patient advised to contact urology clinic if any urologic-pertaining questions, concerns, new symptoms or problems arise in the interim period.  There are no Patient Instructions on file for this visit.  Electronically signed by: Donnita Falls, MSN, FNP-C, CUNP 04/13/2023 10:30 AM

## 2023-04-13 NOTE — Progress Notes (Signed)
 Name: Haley Vang DOB: 11/27/1990 MRN: [id number]  History of Present Illness: [name] is a 33 y.o. female who presents today as a new patient at [facility] . All available relevant medical records have been reviewed.   She reports concern of kidney stone(s).  She denies prior history of kidney stones.  Recent history: > 04/12/2023: Seen in ER for left flank pain and decreased urine output. UA showed 0-5 WBC/hpf, 6-10 RBC/hpf, rare bacteria. CT showed an oblong 7 x 3 mm left UVJ stone with resultant moderate hydroureteronephrosis.  She was discharged with prescriptions for Flomax and Norco 5-325 mg.  Today: She denies flank pain but reports LLQ abdominal pain. Started having nausea and vomiting this morning. Denies fevers. She reports increased urinary urgency and frequency. Saw a small amount of blood in her urine this morning. Mild dysuria.    Fall Screening: Do you usually have a device to assist in your mobility? No   Medications: Current Outpatient Medications  Medication Sig Dispense Refill   acetaminophen (TYLENOL) 500 MG tablet Take 1,000 mg by mouth every 6 (six) hours as needed. For pain      albuterol (PROVENTIL HFA;VENTOLIN HFA) 108 (90 Base) MCG/ACT inhaler Inhale 2 puffs into the lungs every 6 (six) hours as needed for wheezing or shortness of breath.     fluticasone (FLONASE) 50 MCG/ACT nasal spray Place 1 spray into both nostrils daily as needed for allergies or rhinitis.     HYDROcodone-acetaminophen (NORCO/VICODIN) 5-325 MG tablet Take 1 tablet by mouth every 6 (six) hours as needed for severe pain (pain score 7-10). 10 tablet 0   ketorolac (TORADOL) 10 MG tablet Take 1 tablet (10 mg total) by mouth every 6 (six) hours as needed. 30 tablet 0   Lidocaine, Anorectal, 5 % CREA Apply 2.5 g topically 3 (three) times daily as needed. Max 20g per day. 38 g 0   ondansetron (ZOFRAN-ODT) 8 MG disintegrating tablet Take 1 tablet (8 mg total) by mouth  every 8 (eight) hours as needed for nausea or vomiting. 30 tablet 1   tamsulosin (FLOMAX) 0.4 MG CAPS capsule Take 1 capsule (0.4 mg total) by mouth daily after supper. 14 capsule 0   ARIPiprazole (ABILIFY) 2 MG tablet Take 2 mg by mouth at bedtime.      cyclobenzaprine (FLEXERIL) 10 MG tablet Take 10 mg by mouth 3 (three) times daily as needed for muscle spasms.     Prenatal Vit-Fe Fumarate-FA (MULTIVITAMIN-PRENATAL) 27-0.8 MG TABS tablet Take 1 tablet by mouth daily at 12 noon.     No current facility-administered medications for this visit.   Facility-Administered Medications Ordered in Other Visits  Medication Dose Route Frequency Provider Last Rate Last Admin   lactated ringers infusion   Intravenous Continuous PRN Renford Dills, CRNA   New Bag at 09/20/16 1850    Allergies: Allergies  Allergen Reactions   Sprintec 28 [Norgestimate-Eth Estradiol] Rash    Past Medical History:  Diagnosis Date   Anxiety    Asthma    last used inhaler 1.5 months ago   Depression    Headache    Past Surgical History:  Procedure Laterality Date   WISDOM TOOTH EXTRACTION     Family History  Problem Relation Age of Onset   Hypertension Mother    Thyroid disease Mother    Depression Father    Hypertension Father    Stroke Paternal Grandmother    Social History   Socioeconomic History   Marital  status: Married    Spouse name: Not on file   Number of children: Not on file   Years of education: Not on file   Highest education level: Not on file  Occupational History   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  Tobacco Use   Smoking status: Never   Smokeless tobacco: Never  Vaping Use   Vaping status: Never Used  Substance and Sexual Activity   Alcohol use: No   Drug use: No   Sexual activity: Yes  Other Topics Concern   Not on file  Social History Narrative   Not on file   Social Drivers of Health   Financial Resource Strain: Low Risk  (04/12/2023)   Received  from [facility]   Overall Financial Resource Strain (CARDIA)    Difficulty of Paying Living Expenses: Not hard at all  Food Insecurity: No Food Insecurity (04/12/2023)   Received from [facility]   Hunger Vital Sign    Worried About Running Out of Food in the Last Year: Never true    Ran Out of Food in the Last Year: Never true  Transportation Needs: No Transportation Needs (04/12/2023)   Received from [facility] - Transportation    Lack of Transportation (Medical): No    Lack of Transportation (Non-Medical): No  Physical Activity: Insufficiently Active (04/12/2023)   Received from [facility]   Exercise Vital Sign    Days of Exercise per Week: 4 days    Minutes of Exercise per Session: 30 min  Stress: No Stress Concern Present (04/12/2023)   Received from [facility] - Occupational Stress Questionnaire    Feeling of Stress : Not at all  Social Connections: Socially Integrated (04/12/2023)   Received from [facility]   Social Network    How would you rate your social network (family, work, friends)?: Good participation with social networks  Intimate Partner Violence: Not At Risk (04/12/2023)   Received from [facility]   HITS    Over the last 12 months how often did your partner physically hurt you?: Never    Over the last 12 months how often did your partner insult you or talk down to you?: Never    Over the last 12 months how often did your partner threaten you with physical harm?: Never    Over the last 12 months how often did your partner scream or curse at you?: Never    SUBJECTIVE  Review of Systems Constitutional: Patient denies any unintentional weight loss or change in strength lntegumentary: Patient denies any rashes or pruritus Cardiovascular: Patient denies chest pain or syncope Respiratory: Patient denies shortness of breath Gastrointestinal: Per HPI  Musculoskeletal: Patient denies muscle cramps or  weakness Neurologic: Patient denies convulsions or seizures Allergic/Immunologic: Patient denies recent allergic reaction(s) Hematologic/Lymphatic: Patient denies bleeding tendencies Endocrine: Patient denies heat/cold intolerance  GU: As per HPI.  OBJECTIVE Vitals:   04/13/23 0917  BP: (!) 136/90  Pulse: (!) 101  Temp: 98.6 F (37 C)   There is no height or weight on file to calculate BMI.  Physical Examination Constitutional: Patient appears uncomfortable Cardiovascular: No visible lower extremity edema.  Respiratory: The patient does not have audible wheezing/stridor; respirations do not appear labored  Gastrointestinal: Abdomen non-distended; heaving at times during visit Musculoskeletal: Normal ROM of UEs  Skin: No obvious rashes/open sores  Neurologic: CN 2-12 grossly intact Psychiatric: Answered questions appropriately with normal affect  Hematologic/Lymphatic/Immunologic:  No obvious bruises or sites of spontaneous bleeding  Urine microscopy: 6-10 WBC/hpf, >30 RBC/hpf, few bacteria  ASSESSMENT Kidney stones - Plan: Urinalysis, Routine w reflex microscopic, ondansetron (ZOFRAN-ODT) 8 MG disintegrating tablet, ketorolac (TORADOL) 10 MG tablet, Urine culture, Ambulatory Referral For Surgery Scheduling, ketorolac (TORADOL) injection 60 mg  Left ureteral stone - Plan: Urinalysis, Routine w reflex microscopic, ketorolac (TORADOL) 10 MG tablet, Urine culture, Ambulatory Referral For Surgery Scheduling  LLQ pain - Plan: ketorolac (TORADOL) 10 MG tablet, Urine culture, Ambulatory Referral For Surgery Scheduling  Lower urinary tract symptoms (LUTS) - Plan: ketorolac (TORADOL) 10 MG tablet, Urine culture, Ambulatory Referral For Surgery Scheduling  Nausea and vomiting, unspecified vomiting type - Plan: ondansetron (ZOFRAN-ODT) 8 MG disintegrating tablet, ketorolac (TORADOL) 10 MG tablet, Urine culture, Ambulatory Referral For Surgery Scheduling  Abnormal urinalysis - Plan: Urine  culture, Ambulatory Referral For Surgery Scheduling  We reviewed recent history and findings. She is acutely uncomfortable in office today so Toradol 60 mg IM administered. Dr. Ronne Binning was consulted; will plan to proceed with ESWL next week on Tuesday 04/17/2023. Patient was advised of how that procedure works and possible risks and benefits.  Advised to continue Flomax 0.4 mg daily in the interim. Toradol 10 mg PO ordered for pain management and she can take the Norco 5-325 mg prescribed in the ER as needed for severe breakthrough pain. For nausea / vomiting, a prescription was sent for Zofran 8 mg sublingual PRN.  Abnormal UA. Will check urine culture and treat as indicated based on results.  She was advised to contact urology provider or go to the ER if She develops fever >101F, uncontrollable pain, or other significantly concerning symptoms prior to next office visit.  She verbalized understanding and agreement. All questions were answered.   PLAN Advised the following: Urine culture. Continue Flomax (Tamsulosin) 0.4 mg daily. Analgesics PRN for pain. Zofran PRN for nausea. Return for surgery.  Orders Placed This Encounter  Procedures   Urine culture   Urinalysis, Routine w reflex microscopic   Ambulatory Referral For Surgery Scheduling    Referral Priority:   Routine    Referral Type:   Consultation    Referred to Provider:   Malen Gauze, MD    Number of Visits Requested:   1    It has been explained that the patient is to follow regularly with their PCP in addition to all other providers involved in their care and to follow instructions provided by these respective offices. Patient advised to contact urology clinic if any urologic-pertaining questions, concerns, new symptoms or problems arise in the interim period.  There are no Patient Instructions on file for this visit.  Electronically signed by: Donnita Falls, MSN, FNP-C, CUNP 04/13/2023 10:30 AM

## 2023-04-16 ENCOUNTER — Other Ambulatory Visit: Payer: Self-pay

## 2023-04-16 ENCOUNTER — Encounter (HOSPITAL_COMMUNITY)
Admission: RE | Admit: 2023-04-16 | Discharge: 2023-04-16 | Disposition: A | Discharge status: Home / Self Care | Payer: 59 | Source: Ambulatory Visit | Attending: Urology | Admitting: Urology

## 2023-04-16 ENCOUNTER — Encounter (HOSPITAL_COMMUNITY): Payer: Self-pay

## 2023-04-16 HISTORY — DX: Sleep apnea, unspecified: G47.30

## 2023-04-16 LAB — URINE CULTURE

## 2023-04-16 NOTE — Progress Notes (Signed)
 During PAT phone call patient states her last dose of Toradol was 04/15/23 at 1800. Dr.Mckenzie notified via secure chat and he said it was fine to proceed with her ESL tomorrow as schedule. Also patient is to bring her CPAP per Dr.Mckenzie. I called and notified patient of this.

## 2023-04-17 ENCOUNTER — Ambulatory Visit (HOSPITAL_COMMUNITY)

## 2023-04-17 ENCOUNTER — Encounter (HOSPITAL_COMMUNITY)
Admission: RE | Disposition: A | Discharge status: Home / Self Care | Payer: Self-pay | Source: Home / Self Care | Attending: Urology

## 2023-04-17 ENCOUNTER — Telehealth: Payer: Self-pay

## 2023-04-17 ENCOUNTER — Ambulatory Visit (HOSPITAL_COMMUNITY)
Admission: RE | Admit: 2023-04-17 | Discharge: 2023-04-17 | Disposition: A | Discharge status: Home / Self Care | Payer: 59 | Attending: Urology | Admitting: Urology

## 2023-04-17 ENCOUNTER — Encounter (HOSPITAL_COMMUNITY): Payer: Self-pay | Admitting: Urology

## 2023-04-17 DIAGNOSIS — G4730 Sleep apnea, unspecified: Secondary | ICD-10-CM | POA: Diagnosis not present

## 2023-04-17 DIAGNOSIS — Z539 Procedure and treatment not carried out, unspecified reason: Secondary | ICD-10-CM | POA: Insufficient documentation

## 2023-04-17 DIAGNOSIS — Z79899 Other long term (current) drug therapy: Secondary | ICD-10-CM | POA: Insufficient documentation

## 2023-04-17 DIAGNOSIS — Z87442 Personal history of urinary calculi: Secondary | ICD-10-CM | POA: Insufficient documentation

## 2023-04-17 DIAGNOSIS — N132 Hydronephrosis with renal and ureteral calculous obstruction: Secondary | ICD-10-CM | POA: Diagnosis present

## 2023-04-17 DIAGNOSIS — N200 Calculus of kidney: Secondary | ICD-10-CM

## 2023-04-17 DIAGNOSIS — Z01818 Encounter for other preprocedural examination: Secondary | ICD-10-CM

## 2023-04-17 HISTORY — PX: EXTRACORPOREAL SHOCK WAVE LITHOTRIPSY: SHX1557

## 2023-04-17 LAB — POCT PREGNANCY, URINE: Preg Test, Ur: NEGATIVE

## 2023-04-17 SURGERY — LITHOTRIPSY, ESWL
Anesthesia: LOCAL | Laterality: Left

## 2023-04-17 MED ORDER — DIAZEPAM 5 MG PO TABS
10.0000 mg | ORAL_TABLET | Freq: Once | ORAL | Status: AC
  Administered 2023-04-17: 10 mg via ORAL
  Filled 2023-04-17: qty 2

## 2023-04-17 MED ORDER — DIPHENHYDRAMINE HCL 25 MG PO CAPS
25.0000 mg | ORAL_CAPSULE | ORAL | Status: AC
  Administered 2023-04-17: 25 mg via ORAL
  Filled 2023-04-17: qty 1

## 2023-04-17 NOTE — Telephone Encounter (Signed)
 Patient called to states that there were no stones found during her scheduled litho so she did not have the procedure.  She called to see if she could get a follow up appt.  I offered her 03/07 appt with Maralyn Sago, she preferred to keep her scheduled appt on 03/20 originally scheduled as a post op.  Patient advised to call our office if she has any changes in condition prior to 03/20.  Patient voiced understanding.

## 2023-04-17 NOTE — Discharge Instructions (Signed)
 No stone present.    No anesthesia given.

## 2023-04-17 NOTE — Interval H&P Note (Signed)
 History and Physical Interval Note:  04/17/2023 8:39 AM  Haley Vang  has presented today for surgery, with the diagnosis of Left UVJ Stone.  The various methods of treatment have been discussed with the patient and family. After consideration of risks, benefits and other options for treatment, the patient has consented to  Procedure(s): LITHOTRIPSY, ESWL (Left) as a surgical intervention.  The patient's history has been reviewed, patient examined, no change in status, stable for surgery.  I have reviewed the patient's chart and labs.  Questions were answered to the patient's satisfaction.     Wilkie Aye

## 2023-04-17 NOTE — Progress Notes (Signed)
 Patient was transported to the [facility] for [employer]. No stone detected on arrival and case was ended.  No sedation was given. Procedure cancelled and transported back to Phase ll in Short Stay.

## 2023-04-18 ENCOUNTER — Encounter (HOSPITAL_COMMUNITY): Payer: Self-pay | Admitting: Urology

## 2023-04-27 ENCOUNTER — Encounter (HOSPITAL_COMMUNITY): Payer: Self-pay | Admitting: Urology

## 2023-05-01 NOTE — Progress Notes (Deleted)
 Name: Haley Vang DOB: 1990/02/19 MRN: [id number]  History of Present Illness: Haley Vang is a 33 y.o. female who presents today for follow up visit at [facility]. - GU History: 1. Kidney stone x1.  Recent history: > 04/12/2023:  - ER visit for left flank pain.  - CT showed 7 x 3 mm oblong left UVJ stone with moderate left hydroureteronephrosis.  > 04/13/2023:  - Urology office visit.  - Urine microscopy: 6-10 WBC/hpf, >30 RBC/hpf, few bacteria. - Urine culture negative.   - Patient significantly symptomatic; elected ESWL.   > 04/17/2023:  Micah Flesher for left ESWL with Dr. Ronne Binning. Per op note however: "No stone visualized. Administered contrast. Doctor believes patient passed stone." - KUB negative.  ***if still symptomatic, repeat CT stone. May need URS.  Today: She {Actions; denies-reports:120008} flank pain or abdominal pain. She {Actions; denies-reports:120008} fevers, nausea, or vomiting.  She {Actions; denies-reports:120008} increased urinary urgency, frequency, nocturia, dysuria, gross hematuria, hesitancy, straining to void, or sensations of incomplete emptying.  Medications: Current Outpatient Medications  Medication Sig Dispense Refill   acetaminophen (TYLENOL) 500 MG tablet Take 1,000 mg by mouth every 6 (six) hours as needed. For pain      albuterol (PROVENTIL HFA;VENTOLIN HFA) 108 (90 Base) MCG/ACT inhaler Inhale 2 puffs into the lungs every 6 (six) hours as needed for wheezing or shortness of breath.     fluticasone (FLONASE) 50 MCG/ACT nasal spray Place 1 spray into both nostrils daily as needed for allergies or rhinitis.     HYDROcodone-acetaminophen (NORCO/VICODIN) 5-325 MG tablet Take 1 tablet by mouth every 6 (six) hours as needed for severe pain (pain score 7-10). 10 tablet 0   ketorolac (TORADOL) 10 MG tablet Take 1 tablet (10 mg total) by mouth every 6 (six) hours as needed. 30 tablet 0   Lidocaine, Anorectal, 5 % CREA Apply 2.5 g  topically 3 (three) times daily as needed. Max 20g per day. 38 g 0   ondansetron (ZOFRAN-ODT) 8 MG disintegrating tablet Take 1 tablet (8 mg total) by mouth every 8 (eight) hours as needed for nausea or vomiting. 30 tablet 1   tamsulosin (FLOMAX) 0.4 MG CAPS capsule Take 1 capsule (0.4 mg total) by mouth daily after supper. 14 capsule 0   No current facility-administered medications for this visit.   Facility-Administered Medications Ordered in Other Visits  Medication Dose Route Frequency Provider Last Rate Last Admin   lactated ringers infusion   Intravenous Continuous PRN Renford Dills, CRNA   New Bag at 09/20/16 1850    Allergies: Allergies  Allergen Reactions   Sprintec 28 [Norgestimate-Eth Estradiol] Rash    Past Medical History:  Diagnosis Date   Anxiety    Asthma    last used inhaler 1.5 months ago   Depression    Headache    Sleep apnea    Past Surgical History:  Procedure Laterality Date   EXTRACORPOREAL SHOCK WAVE LITHOTRIPSY Left 04/17/2023   Procedure: LITHOTRIPSY, ESWL;  Surgeon: Malen Gauze, MD;  Location: [facility];  Service: Urology;  Laterality: Left;   WISDOM TOOTH EXTRACTION     Family History  Problem Relation Age of Onset   Hypertension Mother    Thyroid disease Mother    Depression Father    Hypertension Father    Stroke Paternal Grandmother    Social History   Socioeconomic History   Marital status: Married    Spouse name: Not on file   Number of children: Not  on file   Years of education: Not on file   Highest education level: Not on file  Occupational History   Occupation: Consulting civil engineer at [employer] in [place]  Tobacco Use   Smoking status: Never   Smokeless tobacco: Never  Vaping Use   Vaping status: Never Used  Substance and Sexual Activity   Alcohol use: No   Drug use: No   Sexual activity: Yes  Other Topics Concern   Not on file  Social History Narrative   Not on file   Social Drivers of Health   Financial  Resource Strain: Low Risk  (04/12/2023)   Received from [facility], [facility]   Overall Financial Resource Strain (CARDIA)    Difficulty of Paying Living Expenses: Not hard at all  Food Insecurity: No Food Insecurity (04/12/2023)   Received from [facility], [facility]   Hunger Vital Sign    Worried About Running Out of Food in the Last Year: Never true    Ran Out of Food in the Last Year: Never true  Transportation Needs: No Transportation Needs (04/12/2023)   Received from [facility], [facility]   PRAPARE - Transportation    Lack of Transportation (Medical): No    Lack of Transportation (Non-Medical): No  Physical Activity: Insufficiently Active (04/12/2023)   Received from [facility], [facility]   Exercise Vital Sign    Days of Exercise per Week: 4 days    Minutes of Exercise per Session: 30 min  Stress: No Stress Concern Present (04/12/2023)   Received from [facility], [facility]: [facility] - Occupational Stress Questionnaire    Feeling of Stress : Not at all  Social Connections: Socially Integrated (04/12/2023)   Received from [facility], [facility]   Social Network    How would you rate your social network (family, work, friends)?: Good participation with social networks  Intimate Partner Violence: Not At Risk (04/12/2023)   Received from [place], [facility]   HITS    Over the last 12 months how often did your partner physically hurt you?: Never    Over the last 12 months how often did your partner insult you or talk down to you?: Never    Over the last 12 months how often did your partner threaten you with physical harm?: Never    Over the last 12 months how often did your partner scream or curse at you?: Never    SUBJECTIVE  Review of Systems Constitutional: Patient denies any unintentional weight loss or change in strength lntegumentary: Patient denies any rashes or pruritus Cardiovascular: Patient  denies chest pain or syncope Respiratory: Patient denies shortness of breath Gastrointestinal: ***Patient {Actions; denies-reports:120008} ***nausea, ***vomiting, ***constipation, ***diarrhea ***As per HPI Musculoskeletal: Patient denies muscle cramps or weakness Neurologic: Patient denies convulsions or seizures Allergic/Immunologic: Patient denies recent allergic reaction(s) Hematologic/Lymphatic: Patient denies bleeding tendencies Endocrine: Patient denies heat/cold intolerance  GU: As per HPI.  OBJECTIVE There were no vitals filed for this visit. There is no height or weight on file to calculate BMI.  Physical Examination Constitutional: No obvious distress; patient is non-toxic appearing  Cardiovascular: No visible lower extremity edema.  Respiratory: The patient does not have audible wheezing/stridor; respirations do not appear labored  Gastrointestinal: Abdomen non-distended Musculoskeletal: Normal ROM of UEs  Skin: No obvious rashes/open sores  Neurologic: CN 2-12 grossly intact Psychiatric: Answered questions appropriately with normal affect  Hematologic/Lymphatic/Immunologic: No obvious bruises or sites of spontaneous bleeding  UA:  ***positive for *** leukocytes, *** blood, ***nitrites ***Urine microscopy:  ***negative  *** WBC/hpf, *** RBC/hpf, *** bacteria ***with no evidence of UTI ***with no evidence of microscopic hematuria ***otherwise unremarkable ***glucosuria (secondary to ***Jardiance ***Farxiga use)  PVR: *** ml  ASSESSMENT No diagnosis found.  ***We reviewed recent imaging results; ***awaiting radiology results, appears to have ***no acute findings per provider interpretation.  ***For stone prevention: Advised adequate hydration and we discussed option to consider low oxalate diet given that calcium oxalate is the most common type of stone. Handout provided about stone prevention diet.  ***For recurrent stone formers: We discussed option to  proceed with 24 hour urinalysis ([facility]) for metabolic stone evaluation, which may help with targeted recommendations for dietary I medication therapies for stone prevention. Patient elected to ***proceed/ ***hold off.  Will plan to follow up in ***6 months / ***1 year with ***KUB ***RUS for stone surveillance or sooner if needed.  Patient verbalized understanding of and agreement with current plan. All questions were answered.  PLAN Advised the following: Maintain adequate fluid intake daily. Drink citrus juice (lemon, lime or orange juice) routinely. Low oxalate diet. No follow-ups on file.  No orders of the defined types were placed in this encounter.   It has been explained that the patient is to follow regularly with their PCP in addition to all other providers involved in their care and to follow instructions provided by these respective offices. Patient advised to contact urology clinic if any urologic-pertaining questions, concerns, new symptoms or problems arise in the interim period.  There are no Patient Instructions on file for this visit.  Electronically signed by:  Donnita Falls, MSN, FNP-C, CUNP 05/01/2023 12:33 PM

## 2023-05-03 ENCOUNTER — Encounter: Payer: 59 | Admitting: Urology

## 2023-05-03 ENCOUNTER — Ambulatory Visit: Admitting: Urology

## 2023-07-07 ENCOUNTER — Emergency Department (HOSPITAL_BASED_OUTPATIENT_CLINIC_OR_DEPARTMENT_OTHER)
Admission: EM | Admit: 2023-07-07 | Discharge: 2023-07-07 | Disposition: A | Discharge status: Home / Self Care | Attending: Emergency Medicine | Admitting: Emergency Medicine

## 2023-07-07 ENCOUNTER — Encounter (HOSPITAL_BASED_OUTPATIENT_CLINIC_OR_DEPARTMENT_OTHER): Payer: Self-pay | Admitting: Emergency Medicine

## 2023-07-07 ENCOUNTER — Other Ambulatory Visit: Payer: Self-pay

## 2023-07-07 ENCOUNTER — Emergency Department (HOSPITAL_BASED_OUTPATIENT_CLINIC_OR_DEPARTMENT_OTHER): Admitting: Radiology

## 2023-07-07 DIAGNOSIS — R0789 Other chest pain: Secondary | ICD-10-CM | POA: Diagnosis present

## 2023-07-07 DIAGNOSIS — R0602 Shortness of breath: Secondary | ICD-10-CM | POA: Diagnosis not present

## 2023-07-07 DIAGNOSIS — R072 Precordial pain: Secondary | ICD-10-CM | POA: Insufficient documentation

## 2023-07-07 HISTORY — DX: Leukemia, unspecified not having achieved remission: C95.90

## 2023-07-07 LAB — CBC
HCT: 34.6 % — ABNORMAL LOW (ref 36.0–46.0)
Hemoglobin: 11.3 g/dL — ABNORMAL LOW (ref 12.0–15.0)
MCH: 28.3 pg (ref 26.0–34.0)
MCHC: 32.7 g/dL (ref 30.0–36.0)
MCV: 86.5 fL (ref 80.0–100.0)
Platelets: 214 10*3/uL (ref 150–400)
RBC: 4 MIL/uL (ref 3.87–5.11)
RDW: 18.4 % — ABNORMAL HIGH (ref 11.5–15.5)
WBC: 4.6 10*3/uL (ref 4.0–10.5)
nRBC: 0 % (ref 0.0–0.2)

## 2023-07-07 LAB — PREGNANCY, URINE: Preg Test, Ur: NEGATIVE

## 2023-07-07 LAB — BASIC METABOLIC PANEL WITH GFR
Anion gap: 12 (ref 5–15)
BUN: 8 mg/dL (ref 6–20)
CO2: 21 mmol/L — ABNORMAL LOW (ref 22–32)
Calcium: 9.3 mg/dL (ref 8.9–10.3)
Chloride: 106 mmol/L (ref 98–111)
Creatinine, Ser: 0.72 mg/dL (ref 0.44–1.00)
GFR, Estimated: >60 mL/min (ref >60)
Glucose, Bld: 94 mg/dL (ref 70–99)
Potassium: 4 mmol/L (ref 3.5–5.1)
Sodium: 139 mmol/L (ref 135–145)

## 2023-07-07 LAB — TROPONIN T, HIGH SENSITIVITY
Troponin T High Sensitivity: <15 ng/L (ref <19)
Troponin T High Sensitivity: <15 ng/L (ref <19)

## 2023-07-07 LAB — D-DIMER, QUANTITATIVE: D-Dimer, Quant: 0.3 ug{FEU}/mL (ref 0.00–0.50)

## 2023-07-07 NOTE — ED Notes (Signed)
 Reviewed discharge instructions and follow up care with pt. Pt verbalized understanding and had no further questions. Pt exited ED without complications.

## 2023-07-07 NOTE — ED Triage Notes (Signed)
 Pt endorses waking up with generalized chest tightness today. Recent dx with leukemia and reports concern for new meds risk of blood clots

## 2023-07-07 NOTE — ED Notes (Signed)
Spoke with lab to add on d-dimer 

## 2023-07-07 NOTE — ED Provider Notes (Signed)
 [place] EMERGENCY DEPARTMENT AT [facility] Provider Note   CSN: [id number] Arrival date & time: 07/07/23  1227     History  Chief Complaint  Patient presents with   Chest Pain    Haley Vang is a 33 y.o. female.  Patient presents to the emergency department today for evaluation of chest tightness.  Symptoms started after waking this morning.  She had some mild shortness of breath associate with this.  Pain is across the mid chest, does not radiate.  No associated vomiting or diaphoresis.  No exertional symptoms.  Symptoms not changed with eating or drinking.  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  She was concerned about the possibility of a blood clot as a side effect of her medication.        Home Medications Prior to Admission medications   Medication Sig Start Date End Date Taking? Authorizing Provider  acetaminophen  (TYLENOL ) 500 MG tablet Take 1,000 mg by mouth every 6 (six) hours as needed. For pain     [provider]  albuterol  (PROVENTIL  HFA;VENTOLIN  HFA) 108 (90 Base) MCG/ACT inhaler Inhale 2 puffs into the lungs every 6 (six) hours as needed for wheezing or shortness of breath.    [provider]  fluticasone  (FLONASE ) 50 MCG/ACT nasal spray Place 1 spray into both nostrils daily as needed for allergies or rhinitis.    [provider]  HYDROcodone -acetaminophen  (NORCO/VICODIN) 5-325 MG tablet Take 1 tablet by mouth every 6 (six) hours as needed for severe pain (pain score 7-10). 04/12/23   Eldon Greenland, MD  ketorolac  (TORADOL ) 10 MG tablet Take 1 tablet (10 mg total) by mouth every 6 (six) hours as needed. 04/13/23   Lauretta Ponto, FNP  Lidocaine , Anorectal, 5 % CREA Apply 2.5 g topically 3 (three) times daily as needed. Max 20g per day. 11/29/22   Rolinda Climes, DO  ondansetron  (ZOFRAN -ODT) 8 MG disintegrating tablet Take 1 tablet (8 mg total) by  mouth every 8 (eight) hours as needed for nausea or vomiting. 04/13/23   Lauretta Ponto, FNP  tamsulosin  (FLOMAX ) 0.4 MG CAPS capsule Take 1 capsule (0.4 mg total) by mouth daily after supper. 04/12/23   Eldon Greenland, MD      Allergies    Sprintec 28 [norgestimate-eth estradiol]    Review of Systems   Review of Systems  Physical Exam Updated Vital Signs BP (!) 141/81   Pulse 96   Temp 98.6 F (37 C)   Resp 18   Wt 127 kg   LMP 06/20/2023   SpO2 100%   BMI 46.59 kg/m   Physical Exam Vitals and nursing note reviewed.  Constitutional:      Appearance: She is well-developed. She is not diaphoretic.  HENT:     Head: Normocephalic and atraumatic.     Mouth/Throat:     Mouth: Mucous membranes are not dry.  Eyes:     Conjunctiva/sclera: Conjunctivae normal.  Neck:     Vascular: Normal carotid pulses. No JVD.     Trachea: Trachea normal. No tracheal deviation.  Cardiovascular:     Rate and Rhythm: Normal rate and regular rhythm.     Pulses: No decreased pulses.          Radial pulses are 2+ on the right side and 2+ on the left side.     Heart sounds: Normal heart sounds, S1 normal and S2 normal.  No murmur heard. Pulmonary:     Effort: Pulmonary effort is normal. No respiratory distress.     Breath sounds: No wheezing.  Chest:     Chest wall: No tenderness.  Abdominal:     General: Bowel sounds are normal.     Palpations: Abdomen is soft.     Tenderness: There is no abdominal tenderness. There is no guarding or rebound.  Musculoskeletal:        General: Normal range of motion.     Cervical back: Normal range of motion and neck supple. No muscular tenderness.  Skin:    General: Skin is warm and dry.     Coloration: Skin is not pale.  Neurological:     Mental Status: She is alert.     ED Results / Procedures / Treatments   Labs (all labs ordered are listed, but only abnormal results are displayed) Labs Reviewed  BASIC METABOLIC PANEL WITH GFR - Abnormal;  Notable for the following components:      Result Value   CO2 21 (*)    All other components within normal limits  CBC - Abnormal; Notable for the following components:   Hemoglobin 11.3 (*)    HCT 34.6 (*)    RDW 18.4 (*)    All other components within normal limits  D-DIMER, QUANTITATIVE  PREGNANCY, URINE  TROPONIN T, HIGH SENSITIVITY  TROPONIN T, HIGH SENSITIVITY    ED ECG REPORT   Date: 07/07/2023  Rate: 91  Rhythm: normal sinus rhythm  QRS Axis: normal  Intervals: normal  ST/T Wave abnormalities: normal  Conduction Disutrbances:none  Narrative Interpretation:   Old EKG Reviewed: unchanged from 03/2023  I have personally reviewed the EKG tracing and agree with the computerized printout as noted.   Radiology DG Chest 2 View Result Date: 07/07/2023 CLINICAL DATA:  Chest pain EXAM: CHEST - 2 VIEW COMPARISON:  None Available. FINDINGS: No consolidation, pneumothorax or effusion. No edema. Normal cardiopericardial silhouette. Overlapping cardiac leads. IMPRESSION: No acute cardiopulmonary disease. Electronically Signed   By: Adrianna Horde M.D.   On: 07/07/2023 13:32    Procedures Procedures    Medications Ordered in ED Medications - No data to display  ED Course/ Medical Decision Making/ A&P    Patient seen and examined. History obtained directly from patient.   Labs/EKG: Ordered CBC shows mild anemia, normal white blood cell counts otherwise unremarkable; BMP unremarkable with only minimally low bicarb at 21; troponin normal.  Patient is low risk Wells with a score of 1 given her active malignancy with treatment.  Discussed D-dimer versus imaging with patient at bedside.  Feel that D-dimer would be appropriate in this situation.  This was ordered.  Awaiting second troponin as well.  Imaging: Ordered chest x-ray agree negative.  Medications/Fluids: None ordered  Most recent vital signs reviewed and are as follows: BP (!) 141/81   Pulse 96   Temp 98.6 F (37 C)    Resp 18   Wt 127 kg   LMP 06/20/2023   SpO2 100%   BMI 46.59 kg/m   Initial impression: Nonspecific chest pain  5:27 PM Reassessment performed. Patient appears stable, comfortable.  Labs personally reviewed and interpreted including: Second troponin remains negative, D-dimer normal at 0.30.  Reviewed pertinent lab work and imaging with patient at bedside. Questions answered.   Most current vital signs reviewed and are as follows: BP 123/78   Pulse 82   Temp 98.6 F (37 C)   Resp 18  Wt 127 kg   LMP 06/20/2023   SpO2 100%   BMI 46.59 kg/m   Plan: Discharge to home.   Prescriptions written for: None  Other home care instructions discussed: Rest, hydration, monitoring of symptoms  Return and follow-up instructions: I encouraged patient to return to ED with severe chest pain, especially if the pain is crushing or pressure-like and spreads to the arms, back, neck, or jaw, or if they have associated sweating, vomiting, or shortness of breath with the pain, or significant pain with activity. We discussed that the evaluation here today indicates a low-risk of serious cause of chest pain, including heart trouble or a blood clot, but no evaluation is perfect and chest pain can evolve with time. The patient verbalized understanding and agreed.  I encouraged patient to follow-up with their provider in the next 72 hours for recheck.  Encouraged her to possibly follow-up with her oncologist to see if her symptoms are common for current therapy.                                 Medical Decision Making Amount and/or Complexity of Data Reviewed Labs: ordered. Radiology: ordered.   For this patient's complaint of chest pain, the following emergent conditions were considered on the differential diagnosis: acute coronary syndrome, pulmonary embolism, pneumothorax, myocarditis, pericardial tamponade, aortic dissection, thoracic aortic aneurysm complication, esophageal perforation.   Other  causes were also considered including: gastroesophageal reflux disease, musculoskeletal pain including costochondritis, pneumonia/pleurisy, herpes zoster, pericarditis.  In regards to possibility of ACS, patient has atypical features of pain, non-ischemic and unchanged EKG and negative troponin(s). Heart score was calculated to be 0.   In regards to possibility of PE, symptoms are atypical for PE and risk profile is low, D-dimer is negative.  Vital signs are reassuring.  The patient's vital signs, pertinent lab work and imaging were reviewed and interpreted as discussed in the ED course. Hospitalization was considered for further testing, treatments, or serial exams/observation. However as patient is well-appearing, has a stable exam, and reassuring studies today, I do not feel that they warrant admission at this time. This plan was discussed with the patient who verbalizes agreement and comfort with this plan and seems reliable and able to return to the Emergency Department with worsening or changing symptoms.          Final Clinical Impression(s) / ED Diagnoses Final diagnoses:  Precordial pain    Rx / DC Orders ED Discharge Orders     None         Lyna Sandhoff, PA-C 07/07/23 1731    Nicklas Barns, MD 07/08/23 [phone]

## 2023-07-07 NOTE — Discharge Instructions (Addendum)
 Please read and follow all provided instructions.  Your diagnoses today include:  1. Precordial pain     Tests performed today include: An EKG of your heart A chest x-ray Cardiac enzymes - a blood test for heart muscle damage, no sign of heart muscle problem Blood counts and electrolytes D-dimer, screening test for blood clot was negative Vital signs. See below for your results today.   Medications prescribed:  None  Take any prescribed medications only as directed.  Follow-up instructions: Please follow-up with your primary care provider as soon as you can for further evaluation of your symptoms.   Return instructions:  SEEK IMMEDIATE MEDICAL ATTENTION IF: You have severe chest pain, especially if the pain is crushing or pressure-like and spreads to the arms, back, neck, or jaw, or if you have sweating, nausea or vomiting, or trouble with breathing. THIS IS AN EMERGENCY. Do not wait to see if the pain will go away. Get medical help at once. Call 911. DO NOT drive yourself to the hospital.  Your chest pain gets worse and does not go away after a few minutes of rest.  You have an attack of chest pain lasting longer than what you usually experience.  You have significant dizziness, if you pass out, or have trouble walking.  You have chest pain not typical of your usual pain for which you originally saw your caregiver.  You have any other emergent concerns regarding your health.  Additional Information: Chest pain comes from many different causes. Your caregiver has diagnosed you as having chest pain that is not specific for one problem, but does not require admission.  You are at low risk for an acute heart condition or other serious illness.   Your vital signs today were: BP 123/78   Pulse 82   Temp 98.6 F (37 C)   Resp 18   Wt 127 kg   LMP 06/20/2023   SpO2 100%   BMI 46.59 kg/m  If your blood pressure (BP) was elevated above 135/85 this visit, please have this repeated  by your doctor within one month. --------------

## 2023-07-07 NOTE — ED Notes (Signed)
